# Patient Record
Sex: Male | Born: 1935 | Race: White | Hispanic: No | Marital: Married | Smoking: Former smoker
Health system: Southern US, Community
[De-identification: ages and names within clinical notes are randomized; demographics above are authoritative.]

## PROBLEM LIST (undated history)

## (undated) DIAGNOSIS — M109 Gout, unspecified: Secondary | ICD-10-CM

## (undated) DIAGNOSIS — N183 Chronic kidney disease, stage 3 unspecified: Secondary | ICD-10-CM

## (undated) DIAGNOSIS — I10 Essential (primary) hypertension: Secondary | ICD-10-CM

## (undated) DIAGNOSIS — K279 Peptic ulcer, site unspecified, unspecified as acute or chronic, without hemorrhage or perforation: Secondary | ICD-10-CM

## (undated) DIAGNOSIS — I340 Nonrheumatic mitral (valve) insufficiency: Secondary | ICD-10-CM

## (undated) DIAGNOSIS — D631 Anemia in chronic kidney disease: Secondary | ICD-10-CM

## (undated) DIAGNOSIS — N200 Calculus of kidney: Secondary | ICD-10-CM

## (undated) DIAGNOSIS — L0591 Pilonidal cyst without abscess: Secondary | ICD-10-CM

## (undated) DIAGNOSIS — I608 Other nontraumatic subarachnoid hemorrhage: Secondary | ICD-10-CM

## (undated) DIAGNOSIS — C801 Malignant (primary) neoplasm, unspecified: Secondary | ICD-10-CM

## (undated) DIAGNOSIS — I480 Paroxysmal atrial fibrillation: Secondary | ICD-10-CM

## (undated) DIAGNOSIS — J9383 Other pneumothorax: Secondary | ICD-10-CM

## (undated) HISTORY — PX: TONSILLECTOMY: SUR1361

## (undated) HISTORY — PX: HERNIA REPAIR: SHX51

## (undated) HISTORY — PX: UPPER GASTROINTESTINAL ENDOSCOPY: SHX188

---

## 1967-02-01 HISTORY — PX: CAROTID STENT: SHX1301

## 2003-10-21 ENCOUNTER — Other Ambulatory Visit: Payer: Self-pay

## 2003-11-17 ENCOUNTER — Ambulatory Visit: Payer: Self-pay | Admitting: Urology

## 2003-11-26 ENCOUNTER — Ambulatory Visit: Payer: Self-pay | Admitting: Radiation Oncology

## 2003-12-02 ENCOUNTER — Ambulatory Visit: Payer: Self-pay | Admitting: Radiation Oncology

## 2004-01-01 ENCOUNTER — Ambulatory Visit: Payer: Self-pay | Admitting: Radiation Oncology

## 2004-02-11 ENCOUNTER — Ambulatory Visit: Payer: Self-pay | Admitting: Radiation Oncology

## 2004-02-16 ENCOUNTER — Ambulatory Visit: Payer: Self-pay | Admitting: Radiation Oncology

## 2004-02-25 ENCOUNTER — Ambulatory Visit: Payer: Self-pay | Admitting: Radiation Oncology

## 2004-03-03 ENCOUNTER — Ambulatory Visit: Payer: Self-pay | Admitting: Radiation Oncology

## 2004-03-31 ENCOUNTER — Ambulatory Visit: Payer: Self-pay | Admitting: Radiation Oncology

## 2004-07-22 ENCOUNTER — Ambulatory Visit: Payer: Self-pay | Admitting: Radiation Oncology

## 2005-02-24 ENCOUNTER — Ambulatory Visit: Payer: Self-pay | Admitting: Radiation Oncology

## 2006-09-14 ENCOUNTER — Ambulatory Visit: Payer: Self-pay | Admitting: Unknown Physician Specialty

## 2007-06-06 ENCOUNTER — Ambulatory Visit: Payer: Self-pay | Admitting: Urology

## 2008-11-05 ENCOUNTER — Ambulatory Visit: Payer: Self-pay | Admitting: Surgery

## 2008-11-10 ENCOUNTER — Ambulatory Visit: Payer: Self-pay | Admitting: Surgery

## 2010-10-26 ENCOUNTER — Ambulatory Visit: Payer: Self-pay | Admitting: Internal Medicine

## 2010-10-27 ENCOUNTER — Ambulatory Visit: Payer: Self-pay | Admitting: Internal Medicine

## 2011-05-02 ENCOUNTER — Ambulatory Visit: Payer: Self-pay | Admitting: Urology

## 2012-05-10 ENCOUNTER — Ambulatory Visit: Payer: Self-pay | Admitting: Unknown Physician Specialty

## 2012-10-15 ENCOUNTER — Ambulatory Visit: Payer: Self-pay | Admitting: Internal Medicine

## 2014-06-03 ENCOUNTER — Other Ambulatory Visit: Payer: Self-pay | Admitting: Orthopedic Surgery

## 2014-06-03 DIAGNOSIS — M75102 Unspecified rotator cuff tear or rupture of left shoulder, not specified as traumatic: Secondary | ICD-10-CM

## 2014-06-14 ENCOUNTER — Ambulatory Visit
Admission: RE | Admit: 2014-06-14 | Discharge: 2014-06-14 | Disposition: A | Discharge status: Home / Self Care | Payer: Medicare Other | Source: Ambulatory Visit | Attending: Orthopedic Surgery | Admitting: Orthopedic Surgery

## 2014-06-14 DIAGNOSIS — M75102 Unspecified rotator cuff tear or rupture of left shoulder, not specified as traumatic: Secondary | ICD-10-CM | POA: Insufficient documentation

## 2014-07-03 DIAGNOSIS — M75122 Complete rotator cuff tear or rupture of left shoulder, not specified as traumatic: Secondary | ICD-10-CM | POA: Insufficient documentation

## 2016-12-24 ENCOUNTER — Emergency Department: Payer: Medicare Other

## 2016-12-24 ENCOUNTER — Other Ambulatory Visit: Payer: Self-pay

## 2016-12-24 ENCOUNTER — Observation Stay: Payer: Medicare Other

## 2016-12-24 ENCOUNTER — Observation Stay
Admission: EM | Admit: 2016-12-24 | Discharge: 2016-12-26 | Disposition: A | Discharge status: Home / Self Care | Payer: Medicare Other | Attending: Surgery | Admitting: Surgery

## 2016-12-24 DIAGNOSIS — K660 Peritoneal adhesions (postprocedural) (postinfection): Secondary | ICD-10-CM | POA: Insufficient documentation

## 2016-12-24 DIAGNOSIS — Z8711 Personal history of peptic ulcer disease: Secondary | ICD-10-CM | POA: Insufficient documentation

## 2016-12-24 DIAGNOSIS — Z85828 Personal history of other malignant neoplasm of skin: Secondary | ICD-10-CM | POA: Insufficient documentation

## 2016-12-24 DIAGNOSIS — Z8679 Personal history of other diseases of the circulatory system: Secondary | ICD-10-CM | POA: Insufficient documentation

## 2016-12-24 DIAGNOSIS — Z87891 Personal history of nicotine dependence: Secondary | ICD-10-CM | POA: Diagnosis not present

## 2016-12-24 DIAGNOSIS — Z9889 Other specified postprocedural states: Secondary | ICD-10-CM | POA: Diagnosis not present

## 2016-12-24 DIAGNOSIS — K810 Acute cholecystitis: Secondary | ICD-10-CM | POA: Diagnosis not present

## 2016-12-24 DIAGNOSIS — R1084 Generalized abdominal pain: Secondary | ICD-10-CM

## 2016-12-24 DIAGNOSIS — Z79899 Other long term (current) drug therapy: Secondary | ICD-10-CM | POA: Insufficient documentation

## 2016-12-24 DIAGNOSIS — R112 Nausea with vomiting, unspecified: Secondary | ICD-10-CM

## 2016-12-24 DIAGNOSIS — Z87442 Personal history of urinary calculi: Secondary | ICD-10-CM | POA: Diagnosis not present

## 2016-12-24 DIAGNOSIS — Z7982 Long term (current) use of aspirin: Secondary | ICD-10-CM | POA: Diagnosis not present

## 2016-12-24 DIAGNOSIS — K819 Cholecystitis, unspecified: Secondary | ICD-10-CM

## 2016-12-24 HISTORY — DX: Calculus of kidney: N20.0

## 2016-12-24 HISTORY — DX: Gout, unspecified: M10.9

## 2016-12-24 HISTORY — DX: Other nontraumatic subarachnoid hemorrhage: I60.8

## 2016-12-24 HISTORY — DX: Malignant (primary) neoplasm, unspecified: C80.1

## 2016-12-24 HISTORY — DX: Pilonidal cyst without abscess: L05.91

## 2016-12-24 HISTORY — DX: Other pneumothorax: J93.83

## 2016-12-24 HISTORY — DX: Peptic ulcer, site unspecified, unspecified as acute or chronic, without hemorrhage or perforation: K27.9

## 2016-12-24 LAB — HEPATIC FUNCTION PANEL
ALBUMIN: 4.6 g/dL (ref 3.5–5.0)
ALK PHOS: 65 U/L (ref 38–126)
ALT: 35 U/L (ref 17–63)
AST: 27 U/L (ref 15–41)
Bilirubin, Direct: 0.2 mg/dL (ref 0.1–0.5)
Indirect Bilirubin: 0.9 mg/dL (ref 0.3–0.9)
TOTAL PROTEIN: 7.7 g/dL (ref 6.5–8.1)
Total Bilirubin: 1.1 mg/dL (ref 0.3–1.2)

## 2016-12-24 LAB — TROPONIN I: Troponin I: <0.03 ng/mL (ref <0.03)

## 2016-12-24 LAB — URINALYSIS, COMPLETE (UACMP) WITH MICROSCOPIC
BILIRUBIN URINE: NEGATIVE
Bacteria, UA: NONE SEEN
GLUCOSE, UA: NEGATIVE mg/dL
Hgb urine dipstick: NEGATIVE
KETONES UR: 5 mg/dL — AB
LEUKOCYTES UA: NEGATIVE
Nitrite: NEGATIVE
PH: 6 (ref 5.0–8.0)
PROTEIN: NEGATIVE mg/dL
SQUAMOUS EPITHELIAL / LPF: NONE SEEN
Specific Gravity, Urine: >1.046 — ABNORMAL HIGH (ref 1.005–1.030)

## 2016-12-24 LAB — BASIC METABOLIC PANEL
ANION GAP: 9 (ref 5–15)
BUN: 17 mg/dL (ref 6–20)
CHLORIDE: 104 mmol/L (ref 101–111)
CO2: 26 mmol/L (ref 22–32)
Calcium: 9.3 mg/dL (ref 8.9–10.3)
Creatinine, Ser: 1.01 mg/dL (ref 0.61–1.24)
GFR calc Af Amer: >60 mL/min (ref >60)
GLUCOSE: 183 mg/dL — AB (ref 65–99)
POTASSIUM: 4.3 mmol/L (ref 3.5–5.1)
Sodium: 139 mmol/L (ref 135–145)

## 2016-12-24 LAB — CBC
HEMATOCRIT: 45.2 % (ref 40.0–52.0)
HEMATOCRIT: 41.3 % (ref 40.0–52.0)
HEMOGLOBIN: 15.5 g/dL (ref 13.0–18.0)
Hemoglobin: 14.1 g/dL (ref 13.0–18.0)
MCH: 32.5 pg (ref 26.0–34.0)
MCH: 32.5 pg (ref 26.0–34.0)
MCHC: 34.3 g/dL (ref 32.0–36.0)
MCHC: 34.1 g/dL (ref 32.0–36.0)
MCV: 94.7 fL (ref 80.0–100.0)
MCV: 95.3 fL (ref 80.0–100.0)
PLATELETS: 151 10*3/uL (ref 150–440)
Platelets: 164 10*3/uL (ref 150–440)
RBC: 4.77 MIL/uL (ref 4.40–5.90)
RBC: 4.33 MIL/uL — AB (ref 4.40–5.90)
RDW: 13.8 % (ref 11.5–14.5)
RDW: 13.7 % (ref 11.5–14.5)
WBC: 8.6 10*3/uL (ref 3.8–10.6)
WBC: 7.7 10*3/uL (ref 3.8–10.6)

## 2016-12-24 LAB — CREATININE, SERUM
Creatinine, Ser: 1.14 mg/dL (ref 0.61–1.24)
GFR calc Af Amer: >60 mL/min (ref >60)
GFR calc non Af Amer: 57 mL/min — ABNORMAL LOW (ref >60)

## 2016-12-24 LAB — TYPE AND SCREEN
ABO/RH(D): O POS
Antibody Screen: NEGATIVE

## 2016-12-24 LAB — LIPASE, BLOOD: LIPASE: 19 U/L (ref 11–51)

## 2016-12-24 MED ORDER — ONDANSETRON HCL 4 MG/2ML IJ SOLN
4.0000 mg | Freq: Four times a day (QID) | INTRAMUSCULAR | Status: DC | PRN
  Administered 2016-12-25: 4 mg via INTRAVENOUS

## 2016-12-24 MED ORDER — OXYCODONE HCL 5 MG PO TABS
5.0000 mg | ORAL_TABLET | ORAL | Status: DC | PRN
  Administered 2016-12-24: 10 mg via ORAL
  Filled 2016-12-24: qty 2

## 2016-12-24 MED ORDER — HYDRALAZINE HCL 20 MG/ML IJ SOLN
10.0000 mg | INTRAMUSCULAR | Status: DC | PRN
  Administered 2016-12-25: 10 mg via INTRAVENOUS

## 2016-12-24 MED ORDER — LACTATED RINGERS IV SOLN
INTRAVENOUS | Status: DC
  Administered 2016-12-24 – 2016-12-25 (×4): via INTRAVENOUS

## 2016-12-24 MED ORDER — DEXTROSE 5 % IV SOLN
2.0000 g | INTRAVENOUS | Status: DC
  Administered 2016-12-24: 2 g via INTRAVENOUS
  Filled 2016-12-24 (×3): qty 2

## 2016-12-24 MED ORDER — PIPERACILLIN-TAZOBACTAM 3.375 G IVPB 30 MIN
3.3750 g | Freq: Once | INTRAVENOUS | Status: AC
  Administered 2016-12-24: 3.375 g via INTRAVENOUS
  Filled 2016-12-24: qty 50

## 2016-12-24 MED ORDER — IOPAMIDOL (ISOVUE-370) INJECTION 76%
125.0000 mL | Freq: Once | INTRAVENOUS | Status: AC | PRN
  Administered 2016-12-24: 125 mL via INTRAVENOUS

## 2016-12-24 MED ORDER — SODIUM CHLORIDE 0.9 % IV BOLUS (SEPSIS)
1000.0000 mL | Freq: Once | INTRAVENOUS | Status: AC
  Administered 2016-12-24: 1000 mL via INTRAVENOUS

## 2016-12-24 MED ORDER — ONDANSETRON 4 MG PO TBDP: 4.0000 mg | ORAL_TABLET | Freq: Four times a day (QID) | ORAL | Status: DC | PRN

## 2016-12-24 MED ORDER — ACETAMINOPHEN 500 MG PO TABS
1000.0000 mg | ORAL_TABLET | Freq: Four times a day (QID) | ORAL | Status: DC
  Administered 2016-12-24 – 2016-12-26 (×7): 1000 mg via ORAL
  Filled 2016-12-24 (×7): qty 2

## 2016-12-24 MED ORDER — ENOXAPARIN SODIUM 40 MG/0.4ML ~~LOC~~ SOLN
40.0000 mg | SUBCUTANEOUS | Status: DC
  Filled 2016-12-24: qty 0.4

## 2016-12-24 MED ORDER — PANTOPRAZOLE SODIUM 40 MG PO TBEC
40.0000 mg | DELAYED_RELEASE_TABLET | Freq: Every day | ORAL | Status: DC
  Administered 2016-12-24 – 2016-12-26 (×2): 40 mg via ORAL
  Filled 2016-12-24 (×2): qty 1

## 2016-12-24 MED ORDER — ONDANSETRON HCL 4 MG/2ML IJ SOLN
4.0000 mg | Freq: Once | INTRAMUSCULAR | Status: AC
  Administered 2016-12-24: 4 mg via INTRAVENOUS
  Filled 2016-12-24: qty 2

## 2016-12-24 MED ORDER — MORPHINE SULFATE (PF) 2 MG/ML IV SOLN
2.0000 mg | INTRAVENOUS | Status: DC | PRN
  Administered 2016-12-25: 2 mg via INTRAVENOUS
  Filled 2016-12-24: qty 1

## 2016-12-24 NOTE — ED Notes (Signed)
Patient r/f XR 

## 2016-12-24 NOTE — Plan of Care (Signed)
Patient has had minimal amount of pain today

## 2016-12-24 NOTE — H&P (Signed)
Patient ID: Craig Ayala, male   DOB: [birth year], 83 y.o.   MRN: [id number]  HPI Craig Ayala is a 83 y.o. male  Just to the emergency room with history of epigastric pain and right upper quadrant pain that started after heavy meal last night.First and he had the pain wason Thursday night after sensing supper Reported at this time the pain la sted for a few hours and last night pain was severe and lasted several hours. Now the pain as subsided. No fevers or chills. No evidence of cholangitis or biliary obstruction.No previous abdominal operations. Is able to perform more than 4 Mets of activity without shortness of breath or chest pain. He is very independent and is able to perform all his  H is mind is pristine  CT personally reviewed, thickened GB w GS. No biliary dilation. LFT nml , nml WBC    HPI  Past Medical History:  Diagnosis Date  . 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 Cancer (HCC)    squamous cell  . Gout   . Kidney stone   . Peptic ulcer   . Pilonidal cyst   . Spontaneous pneumothorax     Past Surgical History:  Procedure Laterality Date  . UPPER GASTROINTESTINAL ENDOSCOPY      No family history on file.  Social History Social History   Tobacco Use  . Smoking status: Former Research scientist (life sciences)  . Smokeless tobacco: Never Used  Substance Use Topics  . Alcohol use: No    Frequency: Never  . Drug use: Not on file    No Known Allergies  Current Facility-Administered Medications  Medication Dose Route Frequency Provider Last Rate Last Dose  . acetaminophen (TYLENOL) tablet 1,000 mg  1,000 mg Oral Q6H Ulysees Robarts F, MD      . cefTRIAXone (ROCEPHIN) 2 g in dextrose 5 % 50 mL IVPB  2 g Intravenous Q24H Quinntin Malter F, MD      . enoxaparin (LOVENOX) injection 40 mg  40 mg Subcutaneous Q24H Lavan Imes F, MD      . hydrALAZINE (APRESOLINE) injection 10 mg  10 mg Intravenous Q2H PRN Taneesha Edgin F, MD      . lactated ringers infusion   Intravenous Continuous Ivah Girardot,  Leeza Heiner F, MD      . morphine 2 MG/ML injection 2 mg  2 mg Intravenous Q4H PRN Alanys Godino F, MD      . ondansetron (ZOFRAN-ODT) disintegrating tablet 4 mg  4 mg Oral Q6H PRN Ariza Evans F, MD       Or  . ondansetron (ZOFRAN) injection 4 mg  4 mg Intravenous Q6H PRN Lakechia Nay F, MD      . oxyCODONE (Oxy IR/ROXICODONE) immediate release tablet 5-10 mg  5-10 mg Oral Q4H PRN Shavawn Stobaugh F, MD      . pantoprazole (PROTONIX) EC tablet 40 mg  40 mg Oral Daily Avanish Cerullo, Marjory Lies, MD       Current Outpatient Medications  Medication Sig Dispense Refill  . aspirin EC 81 MG tablet Take 81 mg by mouth daily.    . Cholecalciferol (D3 ADULT PO) Take 1 tablet by mouth daily.    . Ferrous Sulfate (HM IRON SLOW RELEASE) 142 (45 Fe) MG TBCR Take 45 mg by mouth daily.    . Omega-3 Fatty Acids (FISH OIL) 1200 MG CAPS Take 1,200 mg by mouth 2 (two) times daily.    Marland Kitchen omeprazole (PRILOSEC) 20 MG capsule Take 20 mg by mouth  daily.  3     Review of Systems Full ROS  was asked and was negative except for the information on the HPI  Physical Exam Blood pressure 133/78, pulse 68, temperature 98.4 F (36.9 C), temperature source Oral, resp. rate 12, height 5\' 4"  (1.626 m), weight 101.2 kg (223 lb), SpO2 95 %. CONSTITUTIONAL: NAD EYES: Pupils are equal, round, and reactive to light, Sclera are non-icteric. EARS, NOSE, MOUTH AND THROAT: The oropharynx is clear. The oral mucosa is pink and moist. Hearing is intact to voice. LYMPH NODES:  Lymph nodes in the neck are normal. RESPIRATORY:  Lungs are clear. There is normal respiratory effort, with equal breath sounds bilaterally, and without pathologic use of accessory muscles. CARDIOVASCULAR: Heart is regular without murmurs, gallops, or rubs. GI: The abdomen is soft, mild tenderness RUQ, no peritonitis. There are no palpable masses. There is no hepatosplenomegaly. There are normal bowel sounds in all quadrants. GU: Rectal deferred.   MUSCULOSKELETAL: Normal muscle  strength and tone. No cyanosis or edema.   SKIN: Turgor is good and there are no pathologic skin lesions or ulcers. NEUROLOGIC: Motor and sensation is grossly normal. Cranial nerves are grossly intact. PSYCH:  Oriented to person, place and time. Affect is normal.  Data Reviewed  I have personally reviewed the patient's imaging, laboratory findings and medical records.    Assessment/Plan 83 year old male with early cholecystitis confirmed on CT scan. I will order an ultrasound to make sure there is no evidence of biliary obstruction or need for cholangiogram. I do recommend cholecystectomy The risks, benefits, complications, treatment options, and expected outcomes were discussed with the patient. The possibilities of bleeding, recurrent infection, finding a normal gallbladder, perforation of viscus organs, damage to surrounding structures, bile leak, abscess formation, needing a drain placed, the need for additional procedures, reaction to medication, pulmonary aspiration,  failure to diagnose a condition, the possible need to convert to an open procedure, and creating a complication requiring transfusion or operation were discussed with the patient. The patient and/or family concurred with the proposed plan, giving informed consent.  We will plan for admission, ivf, iv A/BS and lap chole   Caroleen Hamman, MD FACS General Surgeon 12/24/2016, 10:51 AM

## 2016-12-24 NOTE — ED Notes (Signed)
Pt r/f CT

## 2016-12-24 NOTE — ED Provider Notes (Signed)
[facility] Emergency Department Provider Note  ____________________________________________  Time seen: Approximately 6:07 AM  I have reviewed the triage vital signs and the nursing notes.   HISTORY  Chief Complaint Chest Pain   HPI Craig Ayala is a 83 y.o. male with a history of a spontaneous pneumothorax, peptic ulcer disease, kidney stones, and an aneurysm subarachnoid hemorrhage or presents for evaluation of abdominal pain. Patient reports a similar episode 2 nights ago. The pain started after dinner, sharp, diffuse in his abdomen and resolved after he took Aleve. This evening the pain returned after dinner and he was complaining of 7 out of 10 pain located diffusely in his abdomen, radiating to his back, sharp, associated with the 3 episodes of nonbloody nonbilious emesis. Patient noticed 1 episode of black stool yesterday morning. No further episodes. No coffee-ground emesis or hematemesis. No chest pain or shortness of breath. No dysuria hematuria. No prior abdominal surgeries. Patient has recently being started on PO iron.  Past Medical History:  Diagnosis Date  . 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 Cancer (HCC)    squamous cell  . Gout   . Kidney stone   . Peptic ulcer   . Pilonidal cyst   . Spontaneous pneumothorax     Patient Active Problem List   Diagnosis Date Noted  . Cholecystitis, acute 12/24/2016    Past Surgical History:  Procedure Laterality Date  . UPPER GASTROINTESTINAL ENDOSCOPY      Prior to Admission medications   Medication Sig Start Date End Date Taking? Authorizing Provider  aspirin EC 81 MG tablet Take 81 mg by mouth daily.   Yes [provider]  Cholecalciferol (D3 ADULT PO) Take 1 tablet by mouth daily.   Yes [provider]  Ferrous Sulfate (HM IRON SLOW RELEASE) 142 (45 Fe) MG TBCR Take 45 mg by mouth daily.   Yes [provider]  Omega-3 Fatty Acids (FISH OIL) 1200 MG CAPS  Take 1,200 mg by mouth 2 (two) times daily.   Yes [provider]  omeprazole (PRILOSEC) 20 MG capsule Take 20 mg by mouth daily. 10/25/16  Yes [provider]    Allergies Patient has no known allergies.  History reviewed. No pertinent family history.  Social History Social History   Tobacco Use  . Smoking status: Former Research scientist (life sciences)  . Smokeless tobacco: Never Used  Substance Use Topics  . Alcohol use: No    Frequency: Never  . Drug use: Not on file    Review of Systems  Constitutional: Negative for fever. Eyes: Negative for visual changes. ENT: Negative for sore throat. Neck: No neck pain  Cardiovascular: Negative for chest pain. Respiratory: Negative for shortness of breath. Gastrointestinal: + diffuse abdominal pain, nausea, vomiting, and black stool. No diarrhea. Genitourinary: Negative for dysuria. Musculoskeletal: Negative for back pain. Skin: Negative for rash. Neurological: Negative for headaches, weakness or numbness. Psych: No SI or HI  ____________________________________________   PHYSICAL EXAM:  VITAL SIGNS: ED Triage Vitals  Enc Vitals Group     BP 12/24/16 0508 (!) 192/100     Pulse Rate 12/24/16 0508 79     Resp 12/24/16 0508 19     Temp 12/24/16 0508 98.4 F (36.9 C)     Temp Source 12/24/16 0508 Oral     SpO2 12/24/16 0508 100 %     Weight 12/24/16 0504 223 lb (101.2 kg)     Height --      Head Circumference --  Peak Flow --      Pain Score 12/24/16 0504 5     Pain Loc --      Pain Edu? --      Excl. in [place]? --     Constitutional: Alert and oriented. Well appearing and in no apparent distress. HEENT:      Head: Normocephalic and atraumatic.         Eyes: Conjunctivae are normal. Sclera is non-icteric.       Mouth/Throat: Mucous membranes are moist.       Neck: Supple with no signs of meningismus. Cardiovascular: Regular rate and rhythm. No murmurs, gallops, or rubs. 2+ symmetrical distal pulses are present in all  extremities. No JVD. Respiratory: Normal respiratory effort. Lungs are clear to auscultation bilaterally. No wheezes, crackles, or rhonchi.  Gastrointestinal: Soft, non tender, and non distended with positive bowel sounds. No rebound or guarding. rectal exam showing light brown stool guaiac negative  Genitourinary: No CVA tenderness. Musculoskeletal: Nontender with normal range of motion in all extremities. No edema, cyanosis, or erythema of extremities. Neurologic: Normal speech and language. Face is symmetric. Moving all extremities. No gross focal neurologic deficits are appreciated. Skin: Skin is warm, dry and intact. No rash noted. Psychiatric: Mood and affect are normal. Speech and behavior are normal.  ____________________________________________   LABS (all labs ordered are listed, but only abnormal results are displayed)  Labs Reviewed  BASIC METABOLIC PANEL - Abnormal; Notable for the following components:      Result Value   Glucose, Bld 183 (*)    All other components within normal limits  URINALYSIS, COMPLETE (UACMP) WITH MICROSCOPIC - Abnormal; Notable for the following components:   Color, Urine YELLOW (*)    APPearance CLEAR (*)    Specific Gravity, Urine >1.046 (*)    Ketones, ur 5 (*)    All other components within normal limits  CBC - Abnormal; Notable for the following components:   RBC 4.33 (*)    All other components within normal limits  CREATININE, SERUM - Abnormal; Notable for the following components:   GFR calc non Af Amer 57 (*)    All other components within normal limits  CBC  TROPONIN I  HEPATIC FUNCTION PANEL  LIPASE, BLOOD  COMPREHENSIVE METABOLIC PANEL  TYPE AND SCREEN   ____________________________________________  EKG  ED ECG REPORT I, Rudene Re, the attending physician, personally viewed and interpreted this ECG.  Normal sinus rhythm, rate of 76, normal intervals, normal axis, no ST elevations or depressions. Diffuse T-wave  flattening. No significant changes when compared to prior from 2010 ____________________________________________  RADIOLOGY  XR acute abdomen:  1. No acute cardiopulmonary abnormality. 2. No free intraperitoneal air. 3. Upper limits normal gas-filled small bowel without fluid levels or other evidence of small-bowel obstruction.  CT a/p: OND ____________________________________________   PROCEDURES  Procedure(s) performed: None Procedures Critical Care performed:  None ____________________________________________   INITIAL IMPRESSION / ASSESSMENT AND PLAN / ED COURSE  83 y.o. male with a history of a spontaneous pneumothorax, peptic ulcer disease, kidney stones, and an aneurysm subarachnoid hemorrhage or presents for evaluation of abdominal pain, one episode of black stool, NBNB emesis. Patient is extremely well appearing, no distress, he is hypertensive with blood pressure of 192/100, his abdomen is soft and nondistended with positive bowel sounds, no tenderness throughout, rectal exam showing light brown stool guaiac negative. EKG with no ischemic changes. Differential diagnoses including peptic ulcer disease versus GERD versus gastritis versus GB versus pancreatitis versus  kidney stone versus diverticulitis vs mesenteric ischemia vs AAA vs dissection. KUB and chest x-ray with no acute findings. We'll send patient for CT angio abdomen and pelvis, will check CBC, CMP, lipase, urinalysis. We'll give fluids and Zofran.   Clinical Course as of Dec 26 610  Sat Dec 24, 2016  0714 Labs with no acute findings. CT and UA pending. Care transferred to Dr. Jimmye Norman.  [CV]    Clinical Course User Index [CV] Alfred Levins [place], MD     As part of my medical decision making, I reviewed the following data within the [place] notes reviewed and incorporated, Labs reviewed , EKG interpreted , Old EKG reviewed, Old chart reviewed, Patient signed out to Dr. Jimmye Norman,  Radiograph reviewed , Notes from prior ED visits and [place] Controlled Substance Database    Pertinent labs & imaging results that were available during my care of the patient were reviewed by me and considered in my medical decision making (see chart for details).    ____________________________________________   FINAL CLINICAL IMPRESSION(S) / ED DIAGNOSES  Final diagnoses:  Generalized abdominal pain  Non-intractable vomiting with nausea, unspecified vomiting type  Cholecystitis      NEW MEDICATIONS STARTED DURING THIS VISIT:  ED Discharge Orders    None       Note:  This document was prepared using Dragon voice recognition software and may include unintentional dictation errors.    Rudene Re, MD 12/25/16 [phone]

## 2016-12-24 NOTE — ED Notes (Signed)
Dr. Dahlia Byes in room to talk to pt.

## 2016-12-24 NOTE — ED Notes (Signed)
Pt returned from US

## 2016-12-24 NOTE — ED Notes (Signed)
Pt transported to US

## 2016-12-24 NOTE — ED Provider Notes (Signed)
Patient has evidence of acute cholecystitis on CT.  I will discussed with general surgery for admission.   Earleen Newport, MD 12/24/16 [phone]

## 2016-12-24 NOTE — ED Triage Notes (Signed)
Patient c/o right chest pain/epigastric pain radiating to back. Patient reports pain began Wednesday night. Patient describes pain as constant. Patient reports radiation to back and nausea. Patient reports 1 black stool and a history of peptic ulcer.

## 2016-12-25 ENCOUNTER — Observation Stay: Payer: Medicare Other | Admitting: Anesthesiology

## 2016-12-25 ENCOUNTER — Encounter: Payer: Self-pay | Admitting: Anesthesiology

## 2016-12-25 ENCOUNTER — Encounter
Admission: EM | Disposition: A | Discharge status: Home / Self Care | Payer: Self-pay | Source: Home / Self Care | Attending: Emergency Medicine

## 2016-12-25 DIAGNOSIS — K810 Acute cholecystitis: Secondary | ICD-10-CM | POA: Diagnosis not present

## 2016-12-25 HISTORY — PX: CHOLECYSTECTOMY: SHX55

## 2016-12-25 LAB — COMPREHENSIVE METABOLIC PANEL
ALBUMIN: 3.6 g/dL (ref 3.5–5.0)
ALT: 34 U/L (ref 17–63)
ANION GAP: 5 (ref 5–15)
AST: 26 U/L (ref 15–41)
Alkaline Phosphatase: 50 U/L (ref 38–126)
BILIRUBIN TOTAL: 1.1 mg/dL (ref 0.3–1.2)
BUN: 14 mg/dL (ref 6–20)
CALCIUM: 8.6 mg/dL — AB (ref 8.9–10.3)
CO2: 28 mmol/L (ref 22–32)
CREATININE: 0.98 mg/dL (ref 0.61–1.24)
Chloride: 106 mmol/L (ref 101–111)
GFR calc Af Amer: >60 mL/min (ref >60)
GFR calc non Af Amer: >60 mL/min (ref >60)
Glucose, Bld: 96 mg/dL (ref 65–99)
Potassium: 3.8 mmol/L (ref 3.5–5.1)
Sodium: 139 mmol/L (ref 135–145)
TOTAL PROTEIN: 6.2 g/dL — AB (ref 6.5–8.1)

## 2016-12-25 SURGERY — LAPAROSCOPIC CHOLECYSTECTOMY
Anesthesia: General

## 2016-12-25 MED ORDER — MIDAZOLAM HCL 2 MG/2ML IJ SOLN
INTRAMUSCULAR | Status: DC | PRN
  Administered 2016-12-25 (×2): 1 mg via INTRAVENOUS

## 2016-12-25 MED ORDER — BUPIVACAINE-EPINEPHRINE 0.25% -1:200000 IJ SOLN
INTRAMUSCULAR | Status: DC | PRN
  Administered 2016-12-25: 30 mL

## 2016-12-25 MED ORDER — SUCCINYLCHOLINE CHLORIDE 20 MG/ML IJ SOLN
INTRAMUSCULAR | Status: DC | PRN
  Administered 2016-12-25: 100 mg via INTRAVENOUS

## 2016-12-25 MED ORDER — FENTANYL CITRATE (PF) 250 MCG/5ML IJ SOLN
INTRAMUSCULAR | Status: AC
  Filled 2016-12-25: qty 5

## 2016-12-25 MED ORDER — PROPOFOL 10 MG/ML IV BOLUS
INTRAVENOUS | Status: AC
  Filled 2016-12-25: qty 20

## 2016-12-25 MED ORDER — DEXAMETHASONE SODIUM PHOSPHATE 10 MG/ML IJ SOLN
INTRAMUSCULAR | Status: DC | PRN
  Administered 2016-12-25: 10 mg via INTRAVENOUS

## 2016-12-25 MED ORDER — CEFAZOLIN SODIUM-DEXTROSE 2-3 GM-%(50ML) IV SOLR
INTRAVENOUS | Status: DC | PRN
  Administered 2016-12-25: 2 g via INTRAVENOUS

## 2016-12-25 MED ORDER — ROCURONIUM BROMIDE 100 MG/10ML IV SOLN
INTRAVENOUS | Status: DC | PRN
  Administered 2016-12-25: 10 mg via INTRAVENOUS
  Administered 2016-12-25: 40 mg via INTRAVENOUS

## 2016-12-25 MED ORDER — PROPOFOL 10 MG/ML IV BOLUS
INTRAVENOUS | Status: DC | PRN
  Administered 2016-12-25: 150 mg via INTRAVENOUS

## 2016-12-25 MED ORDER — OXYCODONE HCL 5 MG/5ML PO SOLN: 5.0000 mg | Freq: Once | ORAL | Status: DC | PRN

## 2016-12-25 MED ORDER — SUGAMMADEX SODIUM 200 MG/2ML IV SOLN
INTRAVENOUS | Status: AC
  Filled 2016-12-25: qty 2

## 2016-12-25 MED ORDER — MIDAZOLAM HCL 2 MG/2ML IJ SOLN
INTRAMUSCULAR | Status: AC
  Filled 2016-12-25: qty 2

## 2016-12-25 MED ORDER — FENTANYL CITRATE (PF) 100 MCG/2ML IJ SOLN
25.0000 ug | INTRAMUSCULAR | Status: DC | PRN
  Administered 2016-12-25 (×2): 50 ug via INTRAVENOUS

## 2016-12-25 MED ORDER — CEFAZOLIN SODIUM 1 G IJ SOLR
INTRAMUSCULAR | Status: AC
  Filled 2016-12-25: qty 20

## 2016-12-25 MED ORDER — HYDRALAZINE HCL 20 MG/ML IJ SOLN
INTRAMUSCULAR | Status: AC
  Filled 2016-12-25: qty 1

## 2016-12-25 MED ORDER — LIDOCAINE HCL (CARDIAC) 20 MG/ML IV SOLN
INTRAVENOUS | Status: DC | PRN
  Administered 2016-12-25: 80 mg via INTRAVENOUS

## 2016-12-25 MED ORDER — HYDROCODONE-ACETAMINOPHEN 5-325 MG PO TABS: 1.0000 | ORAL_TABLET | ORAL | Status: DC | PRN

## 2016-12-25 MED ORDER — OXYCODONE HCL 5 MG PO TABS: 5.0000 mg | ORAL_TABLET | Freq: Once | ORAL | Status: DC | PRN

## 2016-12-25 MED ORDER — FENTANYL CITRATE (PF) 100 MCG/2ML IJ SOLN
INTRAMUSCULAR | Status: AC
  Filled 2016-12-25: qty 2

## 2016-12-25 MED ORDER — BUPIVACAINE-EPINEPHRINE (PF) 0.25% -1:200000 IJ SOLN
INTRAMUSCULAR | Status: AC
  Filled 2016-12-25: qty 30

## 2016-12-25 MED ORDER — FENTANYL CITRATE (PF) 100 MCG/2ML IJ SOLN
INTRAMUSCULAR | Status: DC | PRN
  Administered 2016-12-25 (×5): 50 ug via INTRAVENOUS

## 2016-12-25 SURGICAL SUPPLY — 45 items
APPLICATOR COTTON TIP 6IN STRL (MISCELLANEOUS) ×3 IMPLANT
APPLIER CLIP 5 13 M/L LIGAMAX5 (MISCELLANEOUS) ×3
BLADE SURG 15 STRL LF DISP TIS (BLADE) ×1 IMPLANT
BLADE SURG 15 STRL SS (BLADE) ×2
CANISTER SUCT 1200ML W/VALVE (MISCELLANEOUS) ×3 IMPLANT
CHLORAPREP W/TINT 26ML (MISCELLANEOUS) ×3 IMPLANT
CHOLANGIOGRAM CATH TAUT (CATHETERS) IMPLANT
CLEANER CAUTERY TIP 5X5 PAD (MISCELLANEOUS) ×1 IMPLANT
CLIP APPLIE 5 13 M/L LIGAMAX5 (MISCELLANEOUS) ×1 IMPLANT
DECANTER SPIKE VIAL GLASS SM (MISCELLANEOUS) ×6 IMPLANT
DERMABOND ADVANCED (GAUZE/BANDAGES/DRESSINGS) ×2
DERMABOND ADVANCED .7 DNX12 (GAUZE/BANDAGES/DRESSINGS) ×1 IMPLANT
DRAPE C-ARM XRAY 36X54 (DRAPES) ×3 IMPLANT
ELECT CAUTERY BLADE 6.4 (BLADE) ×3 IMPLANT
ELECT REM PT RETURN 9FT ADLT (ELECTROSURGICAL) ×3
ELECTRODE REM PT RTRN 9FT ADLT (ELECTROSURGICAL) ×1 IMPLANT
GLOVE BIO SURGEON STRL SZ7 (GLOVE) ×3 IMPLANT
GOWN STRL REUS W/ TWL LRG LVL3 (GOWN DISPOSABLE) ×3 IMPLANT
GOWN STRL REUS W/TWL LRG LVL3 (GOWN DISPOSABLE) ×6
IRRIGATION STRYKERFLOW (MISCELLANEOUS) ×1 IMPLANT
IRRIGATOR STRYKERFLOW (MISCELLANEOUS) ×3
IV CATH ANGIO 12GX3 LT BLUE (NEEDLE) ×3 IMPLANT
IV NS 1000ML (IV SOLUTION) ×2
IV NS 1000ML BAXH (IV SOLUTION) ×1 IMPLANT
L-HOOK LAP DISP 36CM (ELECTROSURGICAL) ×3
LHOOK LAP DISP 36CM (ELECTROSURGICAL) ×1 IMPLANT
NEEDLE HYPO 22GX1.5 SAFETY (NEEDLE) ×3 IMPLANT
PACK LAP CHOLECYSTECTOMY (MISCELLANEOUS) ×3 IMPLANT
PAD CLEANER CAUTERY TIP 5X5 (MISCELLANEOUS) ×2
PENCIL ELECTRO HAND CTR (MISCELLANEOUS) ×3 IMPLANT
POUCH SPECIMEN RETRIEVAL 10MM (ENDOMECHANICALS) ×3 IMPLANT
SCISSORS METZENBAUM CVD 33 (INSTRUMENTS) ×3 IMPLANT
SLEEVE ENDOPATH XCEL 5M (ENDOMECHANICALS) ×6 IMPLANT
SOL ANTI-FOG 6CC FOG-OUT (MISCELLANEOUS) ×1 IMPLANT
SOL FOG-OUT ANTI-FOG 6CC (MISCELLANEOUS) ×2
SPONGE LAP 18X18 5 PK (GAUZE/BANDAGES/DRESSINGS) ×3 IMPLANT
STOPCOCK 4 WAY LG BORE MALE ST (IV SETS) IMPLANT
SUT ETHIBOND 0 MO6 C/R (SUTURE) IMPLANT
SUT MNCRL AB 4-0 PS2 18 (SUTURE) ×3 IMPLANT
SUT VICRYL 0 AB UR-6 (SUTURE) ×6 IMPLANT
SYR 20CC LL (SYRINGE) ×3 IMPLANT
TROCAR XCEL BLUNT TIP 100MML (ENDOMECHANICALS) ×3 IMPLANT
TROCAR XCEL NON-BLD 5MMX100MML (ENDOMECHANICALS) ×3 IMPLANT
TUBING INSUFFLATION (TUBING) ×3 IMPLANT
WATER STERILE IRR 1000ML POUR (IV SOLUTION) ×3 IMPLANT

## 2016-12-25 NOTE — Op Note (Signed)
Laparoscopic Cholecystectomy  Pre-operative Diagnosis: acute cholecystitis  Post-operative Diagnosis: same  Procedure: laparoscopic cholecystectomy  Surgeon: Caroleen Hamman, MD FACS  Anesthesia: Gen. with endotracheal tube   Findings: Acute Cholecystitis  Inflammatory response causing some raw  Areas from liver bed  Estimated Blood Loss: 25-50cc         Drains: none         Specimens: Gallbladder           Complications: none   Procedure Details  The patient was seen again in the Holding Room. The benefits, complications, treatment options, and expected outcomes were discussed with the patient. The risks of bleeding, infection, recurrence of symptoms, failure to resolve symptoms, bile duct damage, bile duct leak, retained common bile duct stone, bowel injury, any of which could require further surgery and/or ERCP, stent, or papillotomy were reviewed with the patient. The likelihood of improving the patient's symptoms with return to their baseline status is good.  The patient and/or family concurred with the proposed plan, giving informed consent.  The patient was taken to Operating Room, identified as Craig Ayala and the procedure verified as Laparoscopic Cholecystectomy.  A Time Out was held and the above information confirmed.  Prior to the induction of general anesthesia, antibiotic prophylaxis was administered. VTE prophylaxis was in place. General endotracheal anesthesia was then administered and tolerated well. After the induction, the abdomen was prepped with Chloraprep and draped in the sterile fashion. The patient was positioned in the supine position.  Cut down technique was used to enter the abdominal cavity and a Hasson trochar was placed after two vicryl stitches were anchored to the fascia. Pneumoperitoneum was then created with CO2 and tolerated well without any adverse changes in the patient's vital signs.  Three 5-mm ports were placed in the right upper quadrant all  under direct vision. All skin incisions  were infiltrated with a local anesthetic agent before making the incision and placing the trocars.   The patient was positioned  in reverse Trendelenburg, tilted slightly to the patient's left.  The gallbladder was identified, the fundus grasped and retracted cephalad. Adhesions were lysed bluntly. The infundibulum was grasped and retracted laterally, exposing the peritoneum overlying the triangle of Calot. This was then divided and exposed in a blunt fashion. An extended critical view of the cystic duct and cystic artery was obtained.  The cystic duct was clearly identified and bluntly dissected.   Artery and duct were double clipped and divided.  The gallbladder was taken from the gallbladder fossa in a retrograde fashion with the electrocautery. The gallbladder was removed and placed in an Endocatch bag. The liver bed was irrigated and inspected. Hemostasis was achieved with the electrocautery. Copious irrigation was utilized and was repeatedly aspirated until clear.  The gallbladder and Endocatch sac were then removed through a port site.    Inspection of the right upper quadrant was performed. No bleeding, bile duct injury or leak, or bowel injury was noted. Pneumoperitoneum was released.  The periumbilical port site was closed with interrumpted 0 Vicryl sutures. 4-0 subcuticular Monocryl was used to close the skin. Dermabond was  applied.  The patient was then extubated and brought to the recovery room in stable condition. Sponge, lap, and needle counts were correct at closure and at the conclusion of the case.               Caroleen Hamman, MD, FACS

## 2016-12-25 NOTE — Plan of Care (Signed)
Pt went for lap choley today. Pt returned to unit around 1445. Pain has been manageable so far.

## 2016-12-25 NOTE — Progress Notes (Signed)
15 minute call to floor. 

## 2016-12-25 NOTE — Anesthesia Postprocedure Evaluation (Signed)
Anesthesia Post Note  Patient: Craig Ayala  Procedure(s) Performed: LAPAROSCOPIC CHOLECYSTECTOMY (N/A )  Patient location during evaluation: PACU Anesthesia Type: General Level of consciousness: awake and alert Pain management: pain level controlled Vital Signs Assessment: post-procedure vital signs reviewed and stable Respiratory status: spontaneous breathing, nonlabored ventilation, respiratory function stable and patient connected to nasal cannula oxygen Cardiovascular status: blood pressure returned to baseline and stable Postop Assessment: no apparent nausea or vomiting Anesthetic complications: no     Last Vitals:  Vitals:   12/25/16 1432 12/25/16 1445  BP: (!) 150/71 (!) 150/75  Pulse: 85 83  Resp: 16   Temp:    SpO2: 96%     Last Pain:  Vitals:   12/25/16 1432  TempSrc:   PainSc: 3                  Precious Haws [place]

## 2016-12-25 NOTE — Transfer of Care (Signed)
Immediate Anesthesia Transfer of Care Note  Patient: Craig Ayala  Procedure(s) Performed: LAPAROSCOPIC CHOLECYSTECTOMY (N/A )  Patient Location: PACU  Anesthesia Type:General  Level of Consciousness: sedated  Airway & Oxygen Therapy: Patient Spontanous Breathing and Patient connected to face mask oxygen  Post-op Assessment: Report given to RN and Post -op Vital signs reviewed and stable  Post vital signs: Reviewed and stable  Last Vitals:  Vitals:   12/25/16 0402 12/25/16 0407  BP: (!) 158/99 (!) 152/72  Pulse: 67 63  Resp: 20   Temp: 36.8 C   SpO2: 93%     Last Pain:  Vitals:   12/25/16 0402  TempSrc: Oral  PainSc:       Patients Stated Pain Goal: 0 (78/41/28 2081)  Complications: No apparent anesthesia complications

## 2016-12-25 NOTE — Anesthesia Procedure Notes (Signed)
Procedure Name: Intubation Date/Time: 12/25/2016 12:47 PM Performed by: Nelda Marseille, CRNA Pre-anesthesia Checklist: Patient identified, Patient being monitored, Timeout performed, Emergency Drugs available and Suction available Patient Re-evaluated:Patient Re-evaluated prior to induction Oxygen Delivery Method: Circle system utilized Preoxygenation: Pre-oxygenation with 100% oxygen Induction Type: IV induction Ventilation: Mask ventilation without difficulty Laryngoscope Size: Mac, 3 and McGraph Grade View: Grade III Tube type: Oral Tube size: 7.5 mm Number of attempts: 1 Airway Equipment and Method: Stylet and Video-laryngoscopy Placement Confirmation: ETT inserted through vocal cords under direct vision,  positive ETCO2 and breath sounds checked- equal and bilateral Secured at: 21 cm Tube secured with: Tape Dental Injury: Teeth and Oropharynx as per pre-operative assessment  Difficulty Due To: Difficulty was anticipated, Difficult Airway- due to large tongue and Difficult Airway- due to anterior larynx

## 2016-12-25 NOTE — Progress Notes (Signed)
The risks, benefits, complications, treatment options, and expected outcomes were discussed with the patient. The possibilities of bleeding, recurrent infection, finding a normal gallbladder, perforation of viscus organs, damage to surrounding structures, bile leak, abscess formation, needing a drain placed, the need for additional procedures, reaction to medication, pulmonary aspiration,  failure to diagnose a condition, the possible need to convert to an open procedure, and creating a complication requiring transfusion or operation were discussed with the patient. The patient and/or family concurred with the proposed plan, giving informed consent.  

## 2016-12-25 NOTE — Anesthesia Post-op Follow-up Note (Signed)
Anesthesia QCDR form completed.        

## 2016-12-25 NOTE — Progress Notes (Signed)
Pt. Tolerated ice chips

## 2016-12-25 NOTE — Anesthesia Preprocedure Evaluation (Signed)
Anesthesia Evaluation  Patient identified by MRN, date of birth, ID band Patient awake    Reviewed: Allergy & Precautions, H&P , NPO status , Patient's Chart, lab work & pertinent test results  History of Anesthesia Complications Negative for: history of anesthetic complications  Airway Mallampati: III  TM Distance: <3 FB Neck ROM: limited    Dental  (+) Chipped, Poor Dentition, Caps, Missing   Pulmonary neg shortness of breath, former smoker,           Cardiovascular Exercise Tolerance: Good (-) angina(-) Past MI and (-) DOE negative cardio ROS       Neuro/Psych negative psych ROS   GI/Hepatic Neg liver ROS, PUD, GERD  Medicated and Controlled,  Endo/Other  negative endocrine ROS  Renal/GU Renal disease     Musculoskeletal   Abdominal   Peds  Hematology negative hematology ROS (+)   Anesthesia Other Findings Past Medical History: No date: Aneurysmal subarachnoid hemorrhage (HCC) No date: Cancer ([place])     Comment:  squamous cell No date: Gout No date: Kidney stone No date: Peptic ulcer No date: Pilonidal cyst No date: Spontaneous pneumothorax  Past Surgical History: No date: UPPER GASTROINTESTINAL ENDOSCOPY  BMI    Body Mass Index:  38.28 kg/m      Reproductive/Obstetrics negative OB ROS                             Anesthesia Physical Anesthesia Plan  ASA: III  Anesthesia Plan: General ETT   Post-op Pain Management:    Induction: Intravenous  PONV Risk Score and Plan: 3 and Ondansetron, Dexamethasone and Midazolam  Airway Management Planned: Oral ETT  Additional Equipment:   Intra-op Plan:   Post-operative Plan: Extubation in OR  Informed Consent: I have reviewed the patients History and Physical, chart, labs and discussed the procedure including the risks, benefits and alternatives for the proposed anesthesia with the patient or authorized representative who  has indicated his/her understanding and acceptance.   Dental Advisory Given  Plan Discussed with: Anesthesiologist, CRNA and Surgeon  Anesthesia Plan Comments: (Patient consented for risks of anesthesia including but not limited to:  - adverse reactions to medications - damage to teeth, lips or other oral mucosa - sore throat or hoarseness - Damage to heart, brain, lungs or loss of life  Patient voiced understanding.)        Anesthesia Quick Evaluation

## 2016-12-26 ENCOUNTER — Encounter: Payer: Self-pay | Admitting: Surgery

## 2016-12-26 DIAGNOSIS — K810 Acute cholecystitis: Secondary | ICD-10-CM | POA: Diagnosis not present

## 2016-12-26 LAB — COMPREHENSIVE METABOLIC PANEL
ALT: 80 U/L — ABNORMAL HIGH (ref 17–63)
AST: 66 U/L — AB (ref 15–41)
Albumin: 3.4 g/dL — ABNORMAL LOW (ref 3.5–5.0)
Alkaline Phosphatase: 55 U/L (ref 38–126)
Anion gap: 8 (ref 5–15)
BUN: 19 mg/dL (ref 6–20)
CHLORIDE: 102 mmol/L (ref 101–111)
CO2: 25 mmol/L (ref 22–32)
Calcium: 8.6 mg/dL — ABNORMAL LOW (ref 8.9–10.3)
Creatinine, Ser: 1.09 mg/dL (ref 0.61–1.24)
GFR calc Af Amer: >60 mL/min (ref >60)
GFR, EST NON AFRICAN AMERICAN: 60 mL/min — AB (ref >60)
Glucose, Bld: 148 mg/dL — ABNORMAL HIGH (ref 65–99)
POTASSIUM: 3.9 mmol/L (ref 3.5–5.1)
SODIUM: 135 mmol/L (ref 135–145)
Total Bilirubin: 1.1 mg/dL (ref 0.3–1.2)
Total Protein: 6.3 g/dL — ABNORMAL LOW (ref 6.5–8.1)

## 2016-12-26 LAB — CBC
HEMATOCRIT: 38.6 % — AB (ref 40.0–52.0)
Hemoglobin: 13.5 g/dL (ref 13.0–18.0)
MCH: 32.9 pg (ref 26.0–34.0)
MCHC: 34.9 g/dL (ref 32.0–36.0)
MCV: 94.2 fL (ref 80.0–100.0)
Platelets: 162 10*3/uL (ref 150–440)
RBC: 4.1 MIL/uL — AB (ref 4.40–5.90)
RDW: 13.9 % (ref 11.5–14.5)
WBC: 9.6 10*3/uL (ref 3.8–10.6)

## 2016-12-26 MED ORDER — OXYCODONE HCL 5 MG PO TABS: 5.0000 mg | ORAL_TABLET | ORAL | 0 refills | Status: DC | PRN

## 2016-12-26 NOTE — Progress Notes (Signed)
Discharge instructions reviewed with the patient.  rx for oxycodone given to the patient

## 2016-12-26 NOTE — Care Management Obs Status (Signed)
[facility] NOTIFICATION   Patient Details  Name: Craig Ayala MRN: [id number] Date of Birth: 1929-10-29   Medicare Observation Status Notification Given:  Yes    Beverly Sessions, RN 12/26/2016, 10:02 AM

## 2016-12-26 NOTE — Discharge Summary (Signed)
Physician Discharge Summary  Patient ID: LANELL DUBIE MRN: [id number] DOB/AGE: 09/03/1929 83 y.o.  Admit date: 12/24/2016 Discharge date: 12/26/2016  Admission Diagnoses:  Discharge Diagnoses:  Active Problems:   Cholecystitis, acute   Discharged Condition: good  Hospital Course: 83 y.o. male presented to [facility] ED for abdominal pain. Workup was found to be significant for imaging demonstrating acute cholecystitis. Informed consent was obtained and documented, and patient underwent uneventful laparoscopic cholecystectomy ([place], 12/25/2016).  Post-operatively, patient's pain improved/resolved and advancement of patient's diet and ambulation were well-tolerated. The remainder of patient's hospital course was essentially unremarkable, and discharge planning was initiated accordingly with patient safely able to be discharged home with appropriate discharge instructions, pain control, and outpatient surgical follow-up after all of his questions were answered to his expressed satisfaction.  Consults: None  Significant Diagnostic Studies: radiology: CT scan: acute cholecystitis and Ultrasound: acute cholecystitis  Treatments: surgery: laparoscopic cholecystectomy ([place], 12/25/2016)  Discharge Exam: Blood pressure 136/72, pulse 80, temperature 98.8 F (37.1 C), temperature source Oral, resp. rate 20, height 5\' 4"  (1.626 m), weight 223 lb (101.2 kg), SpO2 91 %. General appearance: alert, cooperative, no distress and appears younger than stated age GI: abdomen soft and non-distended with minimal peri-incisional tenderness to palpation, post-surgical incisional sites well-approximated without surrounding erythema or drainage  Disposition: 01-Home or Self Care   Allergies as of 12/26/2016   No Known Allergies     Medication List    TAKE these medications   aspirin EC 81 MG tablet Take 81 mg by mouth daily.   D3 ADULT PO Take 1 tablet by mouth daily.   Fish Oil 1200 MG Caps Take  1,200 mg by mouth 2 (two) times daily.   HM IRON SLOW RELEASE 142 (45 Fe) MG Tbcr Generic drug:  Ferrous Sulfate Take 45 mg by mouth daily.   omeprazole 20 MG capsule Commonly known as:  PRILOSEC Take 20 mg by mouth daily.   oxyCODONE 5 MG immediate release tablet Commonly known as:  Oxy IR/ROXICODONE Take 1-2 tablets (5-10 mg total) by mouth every 4 (four) hours as needed for severe pain.      Follow-up Information    Jules Husbands, MD. Go on 01/09/2017.   Specialty:  General Surgery Why:  Monday at 9:15am for hospital follow-up Contact information: [place] [address] [phone]           Signed: Vickie Epley 12/26/2016, 11:46 AM

## 2016-12-26 NOTE — Discharge Instructions (Signed)
In addition to included general post-operative instructions for Laparoscopic Cholecystectomy,  Diet: Gradually resume heart healthy diet with no absolute restrictions, but higher fat foods may cause loose BM's x weeks/months while your body adjusts to not having a gallbladder.  Activity: No heavy lifting >20 pounds (children, pets, laundry, garbage) or strenuous activity until follow-up, but light activity and walking are encouraged. Do not drive or drink alcohol if taking narcotic pain medications.  Wound care: 2 days after surgery (Tuesday, 11/27), may shower/get incision wet with soapy water and pat dry (do not rub incisions), but no baths or submerging incision underwater until follow-up.   Medications: Resume all home medications. For mild to moderate pain: acetaminophen (Tylenol) or ibuprofen (if no kidney disease). Combining Tylenol with alcohol can substantially increase your risk of causing liver disease. Narcotic pain medications, if prescribed, can be used for severe pain, though may cause nausea, constipation, and drowsiness. Do not combine Tylenol and Percocet (or similar) within a 6 hour period as Percocet (and similar) contain(s) Tylenol. If you do not need the narcotic pain medication, you do not need to fill the prescription.  Call office [phone]) at any time if any questions, worsening pain, fevers/chills, bleeding, drainage from incision site, or other concerns.

## 2016-12-27 LAB — SURGICAL PATHOLOGY

## 2017-01-03 ENCOUNTER — Other Ambulatory Visit: Payer: Self-pay

## 2017-01-03 DIAGNOSIS — I10 Essential (primary) hypertension: Secondary | ICD-10-CM | POA: Insufficient documentation

## 2017-01-03 DIAGNOSIS — N200 Calculus of kidney: Secondary | ICD-10-CM | POA: Insufficient documentation

## 2017-01-03 DIAGNOSIS — K259 Gastric ulcer, unspecified as acute or chronic, without hemorrhage or perforation: Secondary | ICD-10-CM | POA: Insufficient documentation

## 2017-01-03 DIAGNOSIS — C61 Malignant neoplasm of prostate: Secondary | ICD-10-CM | POA: Insufficient documentation

## 2017-01-03 DIAGNOSIS — E785 Hyperlipidemia, unspecified: Secondary | ICD-10-CM | POA: Insufficient documentation

## 2017-01-03 DIAGNOSIS — I729 Aneurysm of unspecified site: Secondary | ICD-10-CM | POA: Insufficient documentation

## 2017-01-03 DIAGNOSIS — M109 Gout, unspecified: Secondary | ICD-10-CM | POA: Insufficient documentation

## 2017-01-03 DIAGNOSIS — J984 Other disorders of lung: Secondary | ICD-10-CM | POA: Insufficient documentation

## 2017-01-09 ENCOUNTER — Encounter: Payer: Medicare Other | Admitting: Surgery

## 2017-01-09 ENCOUNTER — Encounter: Payer: Self-pay | Admitting: Surgery

## 2017-01-10 ENCOUNTER — Encounter: Payer: Self-pay | Admitting: Surgery

## 2017-01-10 ENCOUNTER — Ambulatory Visit (INDEPENDENT_AMBULATORY_CARE_PROVIDER_SITE_OTHER): Payer: Medicare Other | Admitting: Surgery

## 2017-01-10 VITALS — BP 163/95 | HR 63 | Temp 98.3°F | Wt 214.0 lb

## 2017-01-10 DIAGNOSIS — Z09 Encounter for follow-up examination after completed treatment for conditions other than malignant neoplasm: Secondary | ICD-10-CM

## 2017-01-10 NOTE — Progress Notes (Signed)
S/p lap chole Path d/w pt in detail No pain, taking Po  PE NAD Abd: soft, nt, incisions c/d/i  A/p Doing well RTC prn No heavy lifting

## 2017-01-10 NOTE — Patient Instructions (Signed)
Please give us a call in case you have any questions or concerns.  

## 2018-06-05 ENCOUNTER — Ambulatory Visit
Admission: RE | Admit: 2018-06-05 | Discharge: 2018-06-05 | Disposition: A | Discharge status: Home / Self Care | Payer: Medicare Other | Source: Ambulatory Visit | Attending: Internal Medicine | Admitting: Internal Medicine

## 2018-06-05 ENCOUNTER — Other Ambulatory Visit: Payer: Self-pay

## 2018-06-05 ENCOUNTER — Other Ambulatory Visit: Payer: Self-pay | Admitting: Internal Medicine

## 2018-06-05 DIAGNOSIS — M79602 Pain in left arm: Secondary | ICD-10-CM | POA: Diagnosis not present

## 2019-09-23 IMAGING — US UPPER LEFT VENOUS EXTREMITY ULTRASOUND
1 series · 14 of 24 positions shown · non-contrast
Comparison: MR shoulder 06/14/2014

CLINICAL DATA: Pain post injury x3 weeks

EXAM:
LEFT UPPER EXTREMITY VENOUS DOPPLER ULTRASOUND
TECHNIQUE: Gray-scale sonography with graded compression, as well as color
Doppler and duplex ultrasound were performed to evaluate the upper
extremity deep venous system from the level of the subclavian vein
and including the jugular, axillary, basilic and upper cephalic
vein. Spectral Doppler was utilized to evaluate flow at rest and
with distal augmentation maneuvers.

[Series 1: upper left venous extremity ultrasound · 0.08mm/px · 56 acquisitions, 14 frames shown]
[im 1/56]
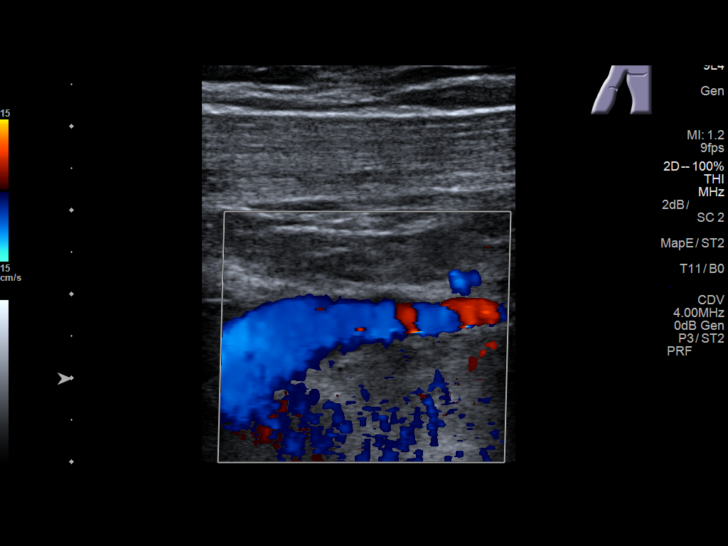
[im 5/56]
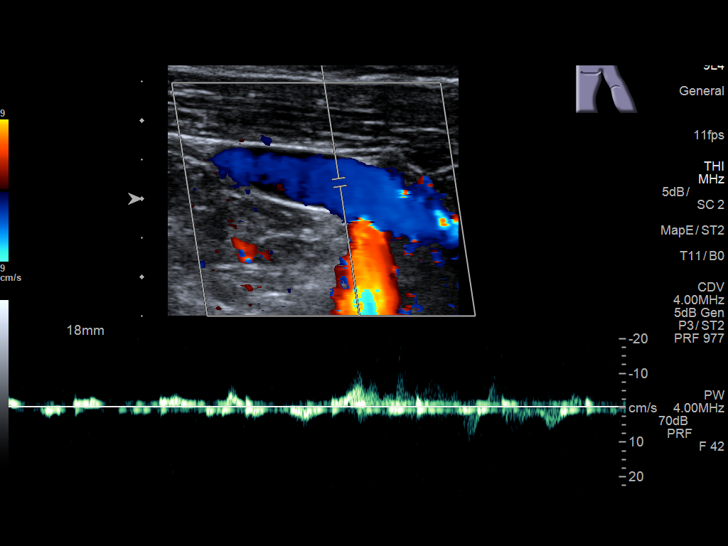
[im 10/56]
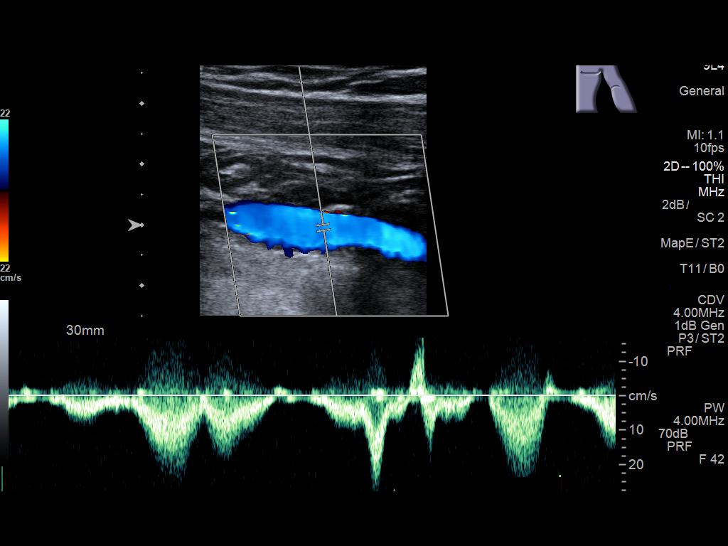
[im 15/56]
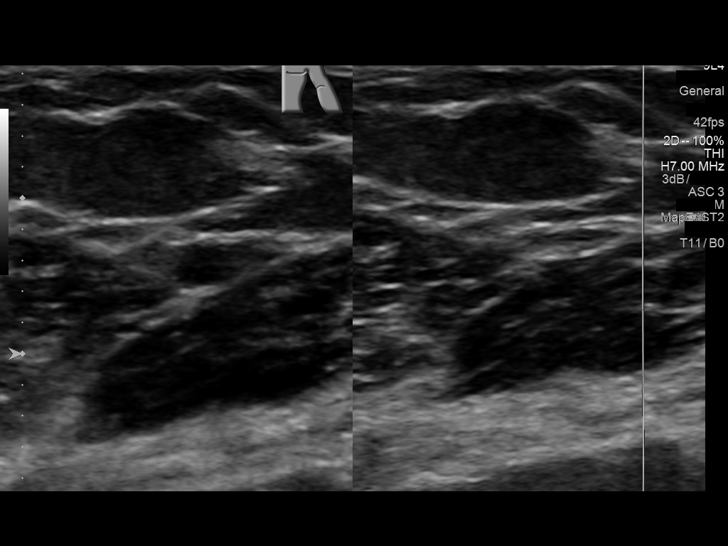
[im 17/56]
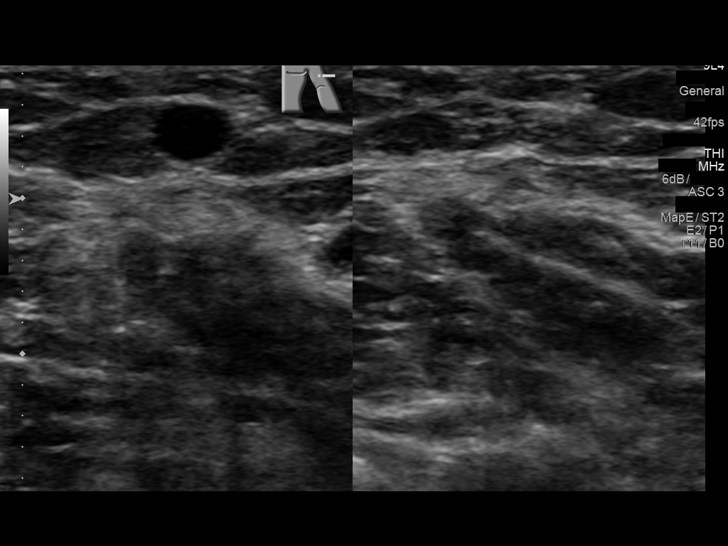
[im 22/56]
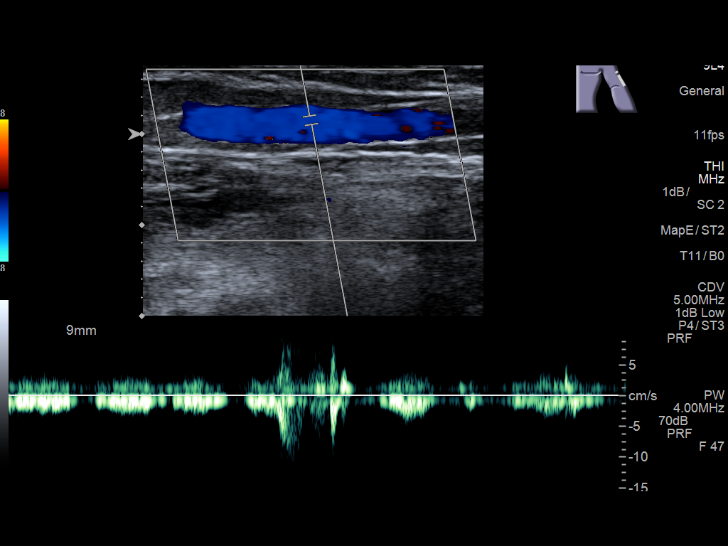
[im 27/56]
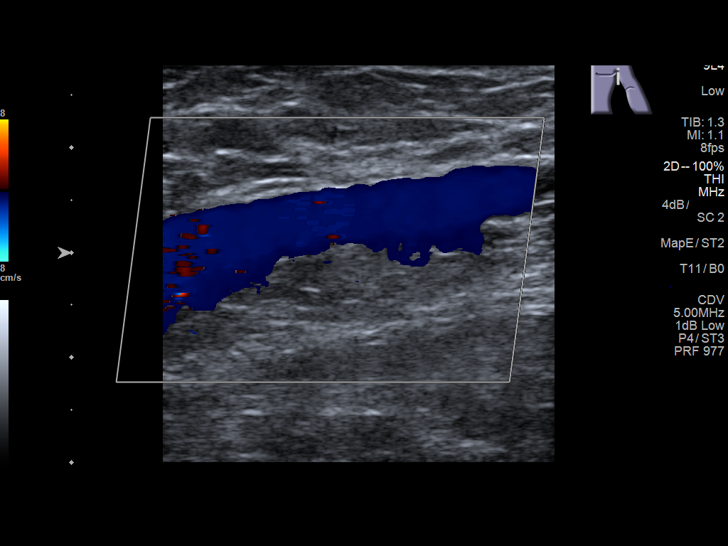
[im 32/56]
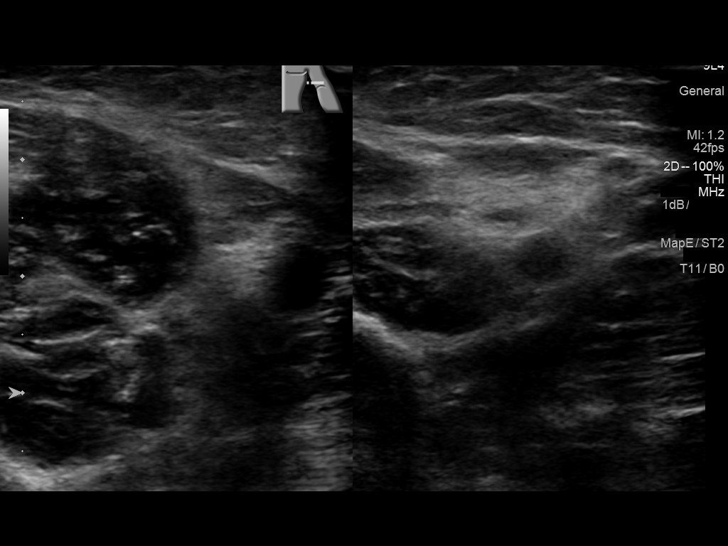
[im 36/56]
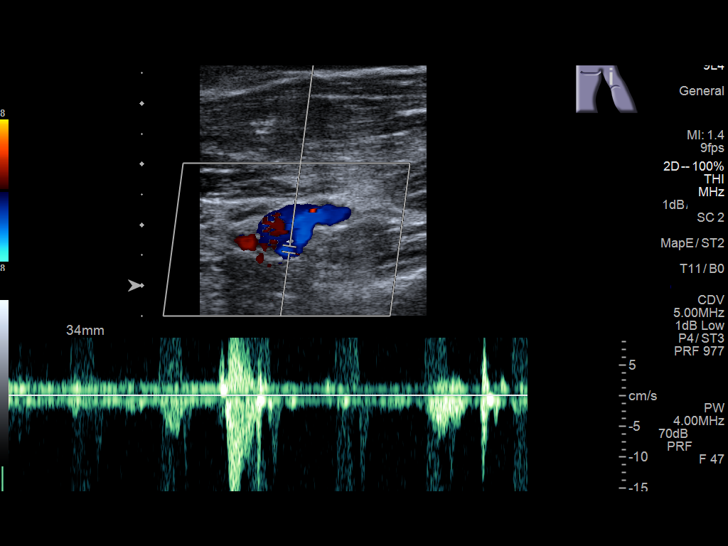
[im 41/56]
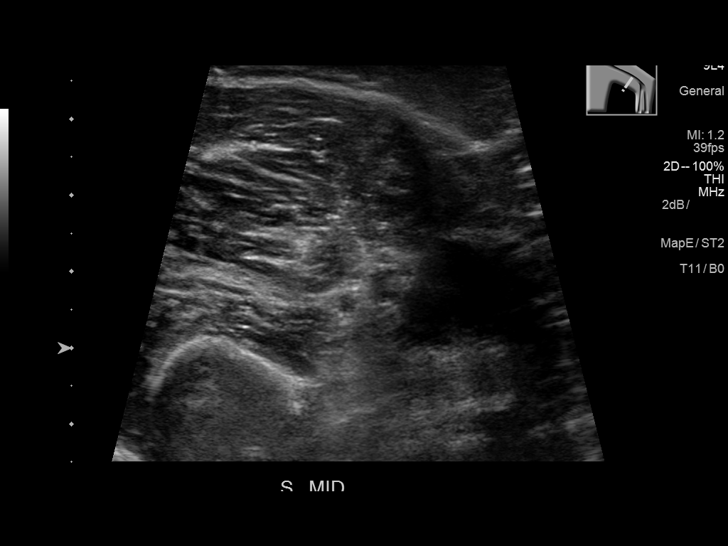
[im 46/56]
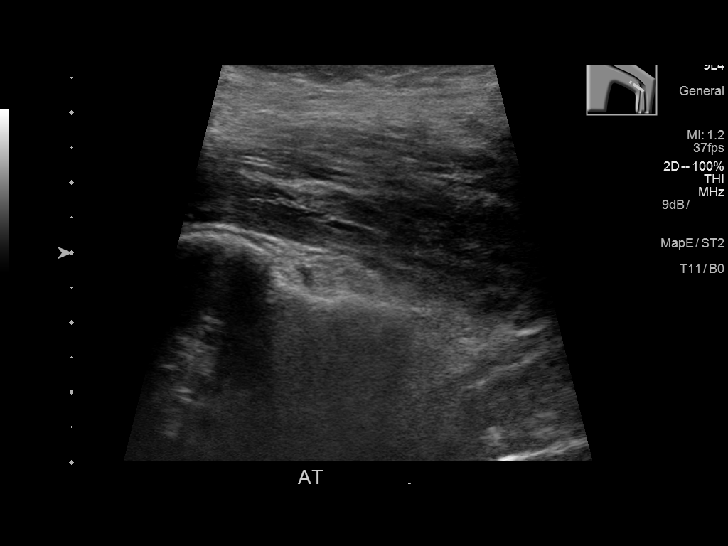
[im 48/56]
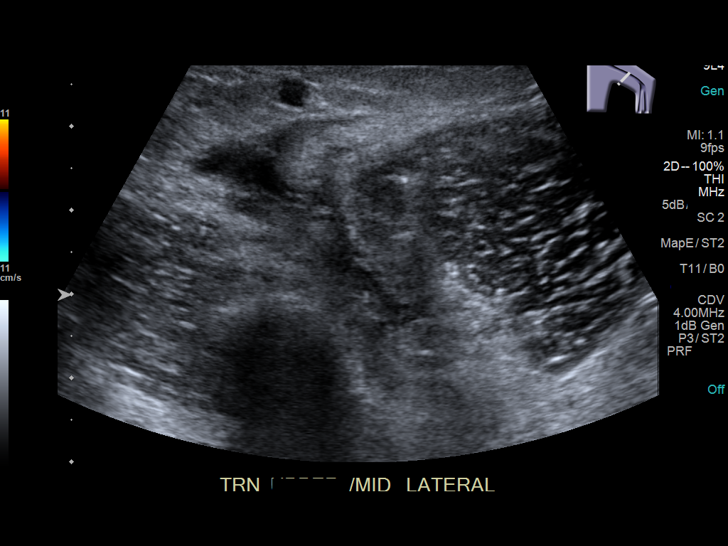
[im 53/56]
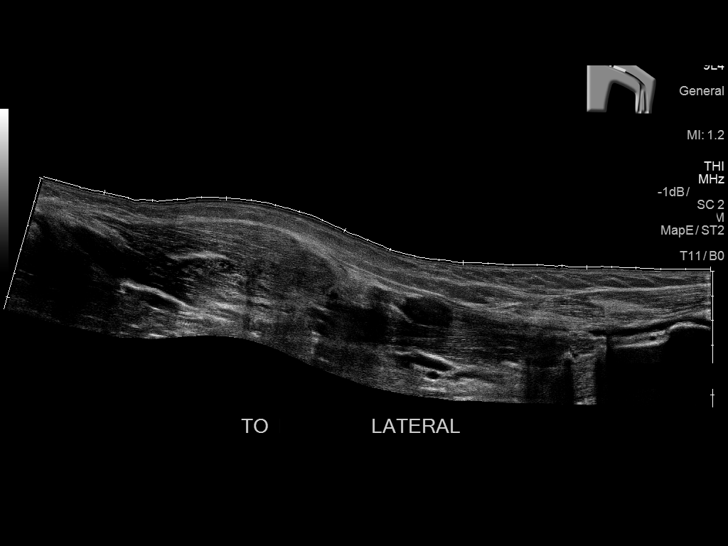
[im 56/56]
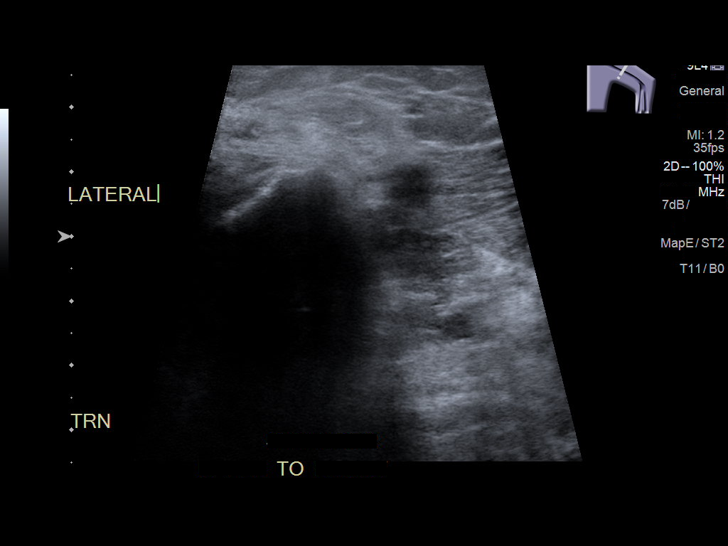

[14 of 24 positions shown; findings below may reference images not displayed]

FINDINGS: Thrombus within deep veins:  None visualized.

Compressibility of deep veins:  Normal.

Duplex waveform respiratory phasicity:  Normal.

Duplex waveform response to augmentation:  Normal.

Venous reflux:  None visualized.

Other findings: Limited views of the contralateral right subclavian
vein are unremarkable.

Focal deep soft tissue swelling in the upper arm in the region of
palpable concern.
IMPRESSION: 1. Negative for left upper extremity DVT.

## 2020-07-31 ENCOUNTER — Ambulatory Visit
Admission: RE | Admit: 2020-07-31 | Discharge: 2020-07-31 | Disposition: A | Discharge status: Home / Self Care | Payer: Medicare Other | Source: Ambulatory Visit | Attending: Urology | Admitting: Urology

## 2020-07-31 ENCOUNTER — Ambulatory Visit (INDEPENDENT_AMBULATORY_CARE_PROVIDER_SITE_OTHER): Payer: Medicare Other | Admitting: Urology

## 2020-07-31 ENCOUNTER — Encounter: Payer: Self-pay | Admitting: Urology

## 2020-07-31 ENCOUNTER — Other Ambulatory Visit: Payer: Self-pay

## 2020-07-31 VITALS — BP 129/80 | HR 64 | Ht 73.0 in | Wt 220.0 lb

## 2020-07-31 DIAGNOSIS — R399 Unspecified symptoms and signs involving the genitourinary system: Secondary | ICD-10-CM | POA: Insufficient documentation

## 2020-07-31 DIAGNOSIS — N200 Calculus of kidney: Secondary | ICD-10-CM | POA: Insufficient documentation

## 2020-07-31 DIAGNOSIS — R3915 Urgency of urination: Secondary | ICD-10-CM | POA: Diagnosis not present

## 2020-07-31 DIAGNOSIS — R351 Nocturia: Secondary | ICD-10-CM

## 2020-07-31 DIAGNOSIS — Z8546 Personal history of malignant neoplasm of prostate: Secondary | ICD-10-CM

## 2020-07-31 LAB — URINALYSIS, COMPLETE
Bilirubin, UA: NEGATIVE
Glucose, UA: NEGATIVE
Ketones, UA: NEGATIVE
Leukocytes,UA: NEGATIVE
Nitrite, UA: NEGATIVE
Protein,UA: NEGATIVE
RBC, UA: NEGATIVE
Specific Gravity, UA: 1.015 (ref 1.005–1.030)
Urobilinogen, Ur: 0.2 mg/dL (ref 0.2–1.0)
pH, UA: 6 (ref 5.0–7.5)

## 2020-07-31 MED ORDER — MIRABEGRON ER 50 MG PO TB24: 50.0000 mg | ORAL_TABLET | Freq: Every day | ORAL | 0 refills | Status: DC

## 2020-07-31 NOTE — Progress Notes (Signed)
   07/31/2020 8:45 AM   Craig Ayala May 03, 1929 [id number]  Referring provider: Idelle Crouch, MD [facility] [place],  [place] [address]  Chief Complaint  Patient presents with   New Patient (Initial Visit)    BPH    HPI: Craig Ayala is an 87 y.o. male referred for evaluation of lower urinary tract symptoms.  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 10/2003 PSA has remained low and was 0.05 05/2020 Bilateral nonobstructing renal calculi on CT 2012   PMH: Past Medical History:  Diagnosis Date   Aneurysmal subarachnoid hemorrhage (HCC)    Cancer ([place])    squamous cell   Gout    Kidney stone    Peptic ulcer    Pilonidal cyst    Spontaneous pneumothorax     Surgical History: Past Surgical History:  Procedure Laterality Date   CHOLECYSTECTOMY N/A 12/25/2016   Procedure: LAPAROSCOPIC CHOLECYSTECTOMY;  Surgeon: Jules Husbands, MD;  Location: [facility] ORS;  Service: General;  Laterality: N/A;   UPPER GASTROINTESTINAL ENDOSCOPY      Home Medications:  Allergies as of 07/31/2020   No Known Allergies      Medication List        Accurate as of July 31, 2020  8:45 AM. If you have any questions, ask your nurse or doctor.          aspirin EC 81 MG tablet Take 81 mg by mouth daily.   D3 ADULT PO Take 1 tablet by mouth daily.   Ferrous Sulfate 142 (45 Fe) MG Tbcr Take 45 mg by mouth daily.   Fish Oil 1200 MG Caps Take 1,200 mg by mouth 2 (two) times daily.   omeprazole 20 MG capsule Commonly known as: PRILOSEC Take 20 mg by mouth  daily.        Allergies: No Known Allergies  Family History: No family history on file.  Social History:  reports that he has quit smoking. He has never used smokeless tobacco. He reports that he does not drink alcohol. No history on file for drug use.   Physical Exam: BP 129/80   Pulse 64   Ht 6\' 1"  (1.854 m)   Wt 220 lb (99.8 kg)   BMI 29.03 kg/m   Constitutional:  Alert and oriented, No acute distress. HEENT: [place] AT, moist mucus membranes.  Trachea midline, no masses. Cardiovascular: No clubbing, cyanosis, or edema. Respiratory: Normal respiratory effort, no increased work of breathing.  Laboratory Data:  Urinalysis Dipstick/microscopy negative  Assessment & Plan:    1.  Lower urinary tract symptoms With prior history of radiation therapy BPH would be less likely Most likely overactive bladder Trial Myrbetriq 50 mg daily-samples given Bladder scan PVR 76 mL Follow-up 1 month for reassessment and if no improvement will perform cystoscopy at that visit to evaluate for stricture/bladder neck contracture  2.  Bilateral nephrolithiasis KUB ordered  3.  Personal history of prostate cancer PSA remains low and stable >15 years posttreatment   Abbie Sons, MD  [place] [address] [place], [place] [address] [phone]

## 2020-07-31 NOTE — Patient Instructions (Signed)
Cystoscopy Cystoscopy is a procedure that is used to help diagnose and sometimes treat conditions that affect the lower urinary tract. The lower urinary tract includes the bladder and the urethra. The urethra is the tube that drains urine from the bladder. Cystoscopy is done using a thin, tube-shaped instrument with a light and camera at the end (cystoscope). The cystoscope may be hard or flexible, depending on the goal of the procedure. The cystoscope is inserted through the urethra, into the bladder. Cystoscopy may be recommended if you have: Urinary tract infections that keep coming back. Blood in the urine (hematuria). An inability to control when you urinate (urinary incontinence) or an overactive bladder. Unusual cells found in a urine sample. A blockage in the urethra, such as a urinary stone. Painful urination. An abnormality in the bladder found during an intravenous pyelogram (IVP) or CT scan. Cystoscopy may also be done to remove a sample of tissue to be examined under a microscope (biopsy). What are the risks? Generally, this is a safe procedure. However, problems may occur, including: Infection. Bleeding.  What happens during the procedure?  You will be given one or more of the following: A medicine to numb the area (local anesthetic). The area around the opening of your urethra will be cleaned. The cystoscope will be passed through your urethra into your bladder. Germ-free (sterile) fluid will flow through the cystoscope to fill your bladder. The fluid will stretch your bladder so that your health care provider can clearly examine your bladder walls. Your doctor will look at the urethra and bladder. The cystoscope will be removed The procedure may vary among health care providers  What can I expect after the procedure? After the procedure, it is common to have: Some soreness or pain in your abdomen and urethra. Urinary symptoms. These include: Mild pain or burning when you  urinate. Pain should stop within a few minutes after you urinate. This may last for up to 1 week. A small amount of blood in your urine for several days. Feeling like you need to urinate but producing only a small amount of urine. Follow these instructions at home: General instructions Return to your normal activities as told by your health care provider.  Do not drive for 24 hours if you were given a sedative during your procedure. Watch for any blood in your urine. If the amount of blood in your urine increases, call your health care provider. If a tissue sample was removed for testing (biopsy) during your procedure, it is up to you to get your test results. Ask your health care provider, or the department that is doing the test, when your results will be ready. Drink enough fluid to keep your urine pale yellow. Keep all follow-up visits as told by your health care provider. This is important. Contact a health care provider if you: Have pain that gets worse or does not get better with medicine, especially pain when you urinate. Have trouble urinating. Have more blood in your urine. Get help right away if you: Have blood clots in your urine. Have abdominal pain. Have a fever or chills. Are unable to urinate. Summary Cystoscopy is a procedure that is used to help diagnose and sometimes treat conditions that affect the lower urinary tract. Cystoscopy is done using a thin, tube-shaped instrument with a light and camera at the end. After the procedure, it is common to have some soreness or pain in your abdomen and urethra. Watch for any blood in your urine.   If the amount of blood in your urine increases, call your health care provider. If you were prescribed an antibiotic medicine, take it as told by your health care provider. Do not stop taking the antibiotic even if you start to feel better. This information is not intended to replace advice given to you by your health care provider. Make  sure you discuss any questions you have with your health care provider. Document Revised: 01/09/2018 Document Reviewed: 01/09/2018 Elsevier Patient Education  2020 Elsevier Inc.  

## 2020-08-03 ENCOUNTER — Encounter: Payer: Self-pay | Admitting: Urology

## 2020-08-04 ENCOUNTER — Encounter: Payer: Self-pay | Admitting: *Deleted

## 2020-08-07 ENCOUNTER — Other Ambulatory Visit: Payer: Self-pay | Admitting: Urology

## 2020-09-02 ENCOUNTER — Telehealth: Payer: Self-pay | Admitting: Urology

## 2020-09-02 NOTE — Telephone Encounter (Signed)
If he feels like the Myrbetriq is helping his urinary symptoms then can send in an Rx.  If he did not see any significant improvement then would discontinue

## 2020-09-02 NOTE — Telephone Encounter (Signed)
Patient cx his cysto for Friday and wants to know if he still needs to take  Myrbetriq 50 mg? He said if he does then he would need a script called in. He did not want to reschedule the cysto appt

## 2020-09-03 MED ORDER — MIRABEGRON ER 50 MG PO TB24: 50.0000 mg | ORAL_TABLET | Freq: Every day | ORAL | 3 refills | Status: DC

## 2020-09-03 NOTE — Addendum Note (Signed)
Addended by: Kyra Manges on: 09/03/2020 02:58 PM   Modules accepted: Orders

## 2020-09-03 NOTE — Telephone Encounter (Signed)
Patient states he will continue it a little while longer. RX sent to pharmacy.

## 2020-09-04 ENCOUNTER — Other Ambulatory Visit: Payer: Self-pay | Admitting: Urology

## 2020-10-11 ENCOUNTER — Encounter: Payer: Self-pay | Admitting: Urology

## 2020-10-29 ENCOUNTER — Encounter: Payer: Self-pay | Admitting: Urology

## 2020-10-29 ENCOUNTER — Other Ambulatory Visit: Payer: Self-pay

## 2020-10-29 ENCOUNTER — Ambulatory Visit (INDEPENDENT_AMBULATORY_CARE_PROVIDER_SITE_OTHER): Payer: Medicare Other | Admitting: Urology

## 2020-10-29 VITALS — BP 162/80 | HR 82 | Ht 73.0 in | Wt 220.0 lb

## 2020-10-29 DIAGNOSIS — N35812 Other urethral bulbous stricture, male: Secondary | ICD-10-CM | POA: Diagnosis not present

## 2020-10-29 DIAGNOSIS — R3915 Urgency of urination: Secondary | ICD-10-CM

## 2020-10-29 DIAGNOSIS — Z8546 Personal history of malignant neoplasm of prostate: Secondary | ICD-10-CM | POA: Diagnosis not present

## 2020-10-29 DIAGNOSIS — R399 Unspecified symptoms and signs involving the genitourinary system: Secondary | ICD-10-CM

## 2020-10-29 NOTE — Progress Notes (Signed)
   10/29/2020 9:20 AM   [name] [birth year] [id number]  Referring provider: Idelle Crouch, MD [facility] [place],  [place] [address]  Chief Complaint  Patient presents with   Cysto     HPI: 87 y.o. male with bothersome lower urinary tract symptoms.  Cystoscopy today with a <10 French bulbar stricture.  Cystoscopy with balloon dilation was recommended.   PMH: Past Medical History:  Diagnosis Date   Aneurysmal subarachnoid hemorrhage ([place])    Cancer (HCC)    squamous cell   Gout    Kidney stone    Peptic ulcer    Pilonidal cyst    Spontaneous pneumothorax     Surgical History: Past Surgical History:  Procedure Laterality Date   CHOLECYSTECTOMY N/A 12/25/2016   Procedure: LAPAROSCOPIC CHOLECYSTECTOMY;  Surgeon: Jules Husbands, MD;  Location: [facility] ORS;  Service: General;  Laterality: N/A;   UPPER GASTROINTESTINAL ENDOSCOPY      Home Medications:  Allergies as of 10/29/2020   No Known Allergies      Medication List        Accurate as of October 29, 2020  9:20 AM. If you have any questions, ask your nurse or doctor.          aspirin EC 81 MG tablet Take 81 mg by mouth daily.   bisoprolol-hydrochlorothiazide 10-6.25 MG tablet Commonly known as: ZIAC Take 1 tablet by mouth daily.   D3 ADULT PO Take 1 tablet by mouth daily.   Ferrous Sulfate 142 (45 Fe) MG Tbcr Take 45 mg by mouth daily.   Fish Oil 1200 MG Caps Take 1,200 mg by mouth 2 (two) times daily.   latanoprost 0.005 % ophthalmic solution Commonly known as: XALATAN 1 drop at bedtime.   mirabegron ER 50 MG Tb24 tablet Commonly known as: Myrbetriq Take 1 tablet (50 mg total) by mouth daily.   omeprazole 20 MG capsule Commonly known as: PRILOSEC Take 20 mg by mouth daily.        Allergies: No Known Allergies  Family History: No family history on file.  Social History:  reports that he has quit smoking. He has never used smokeless tobacco. He  reports that he does not drink alcohol. No history on file for drug use.   Physical Exam: BP (!) 162/80   Pulse 82   Ht 6\' 1"  (1.854 m)   Wt 220 lb (99.8 kg)   BMI 29.03 kg/m   Constitutional:  Alert and oriented, No acute distress. HEENT: [place] AT, moist mucus membranes.  Trachea midline, no masses. Cardiovascular: RRR Respiratory: Clear Psychiatric: Normal mood and affect.   Assessment & Plan:    1.  Bulbar urethral stricture Cystoscopy with balloon dilation (Optilume DBC) The procedure was discussed in detail including the need for a urethral catheter 48 hours postop and the possibility of bleeding, infection and recurrent stricture All questions were answered and he desires to schedule   Abbie Sons, MD  [place] [address] [place], [place] [address] [phone]

## 2020-10-29 NOTE — Progress Notes (Signed)
   10/29/20  CC:  Chief Complaint  Patient presents with   Cysto    HPI: Initially seen July 2022 for lower urinary tract symptoms.  Cystoscopy was scheduled and he was given a trial of Myrbetriq.  He thought his symptoms improved on Myrbetriq and canceled his cystoscopy however decided to reschedule  Blood pressure (!) 162/80, pulse 82, height 6\' 1"  (1.854 m), weight 220 lb (99.8 kg). NED. A&Ox3.   No respiratory distress   Abd soft, NT, ND Normal phallus with bilateral descended testicles  Cystoscopy Procedure Note  Patient identification was confirmed, informed consent was obtained, and patient was prepped using Betadine solution.  Lidocaine jelly was administered per urethral meatus.     Pre-Procedure: - Inspection reveals a normal caliber urethral meatus.  Procedure: The flexible cystoscope was introduced without difficulty - <10 French bulbar urethral stricture; unable to advance scope    Post-Procedure: - Patient tolerated the procedure well  Assessment/ Plan: Bulbar urethral stricture Schedule cystoscopy with balloon dilation (Optilume DBC)    Abbie Sons, MD

## 2020-10-30 LAB — MICROSCOPIC EXAMINATION: Bacteria, UA: NONE SEEN

## 2020-10-30 LAB — URINALYSIS, COMPLETE
Bilirubin, UA: NEGATIVE
Glucose, UA: NEGATIVE
Ketones, UA: NEGATIVE
Leukocytes,UA: NEGATIVE
Nitrite, UA: NEGATIVE
Protein,UA: NEGATIVE
RBC, UA: NEGATIVE
Specific Gravity, UA: 1.025 (ref 1.005–1.030)
Urobilinogen, Ur: 0.2 mg/dL (ref 0.2–1.0)
pH, UA: 5.5 (ref 5.0–7.5)

## 2020-11-01 ENCOUNTER — Other Ambulatory Visit: Payer: Self-pay | Admitting: Urology

## 2020-11-01 DIAGNOSIS — N35812 Other urethral bulbous stricture, male: Secondary | ICD-10-CM

## 2020-11-01 NOTE — Progress Notes (Signed)
Surgical Physician Order Form  ** Scheduling expectation :  Check with patient-needs to arrange care for his wife the day of surgery  *Length of Case: 30 minutes  *Clearance needed: no  *Anticoagulation Instructions: N/A  *Aspirin Instructions: Hold Aspirin  *Post-op visit Date/Instructions:   Cath removal 2-3 days with PA; 4-6-week follow-up with me  *Diagnosis: Urethral Stricture  *Procedure: Cysto w/urethral dilation (26834) Optilume DBC  -Admit type: OUTpatient  -Anesthesia: MAC  -VTE Prophylaxis Standing Order SCD's       Other:   -Standing Lab Orders Per Anesthesia    Lab other: UA&Urine Culture  -Standing Test orders EKG/Chest x-ray per Anesthesia       Test other:   - Medications:     Ancef 2gm IV   Other Instructions:

## 2020-11-02 ENCOUNTER — Encounter: Payer: Self-pay | Admitting: Urology

## 2020-11-02 ENCOUNTER — Other Ambulatory Visit: Payer: Self-pay

## 2020-11-02 ENCOUNTER — Ambulatory Visit (INDEPENDENT_AMBULATORY_CARE_PROVIDER_SITE_OTHER): Payer: Medicare Other | Admitting: Urology

## 2020-11-02 VITALS — BP 195/82 | HR 80 | Ht 75.0 in | Wt 220.0 lb

## 2020-11-02 DIAGNOSIS — N35812 Other urethral bulbous stricture, male: Secondary | ICD-10-CM | POA: Diagnosis not present

## 2020-11-02 MED ORDER — SULFAMETHOXAZOLE-TRIMETHOPRIM 800-160 MG PO TABS: 1.0000 | ORAL_TABLET | Freq: Two times a day (BID) | ORAL | 0 refills | Status: AC

## 2020-11-02 MED ORDER — SULFAMETHOXAZOLE-TRIMETHOPRIM 800-160 MG PO TABS
1.0000 | ORAL_TABLET | Freq: Once | ORAL | Status: AC
  Administered 2020-11-02: 1 via ORAL

## 2020-11-02 NOTE — Progress Notes (Signed)
Procedure Note  Diagnosis: 1.  Bulbous urethral stricture 2.  Urinary retention  Procedure: Complex catheter placement  Indications: 87 y.o. male with a history of progressively worsening voiding symptoms and urethroscopy last week remarkable for a bulbar stricture.  He called the office today stating he had been unable to urinate since 10 PM last night and is uncomfortable.  Description: External genitalia were prepped and draped.  A flexible cystoscope was lubricated, passed per urethra and advanced proximally to the level of the bulbar urethral stricture.  A 0.038 Sensor wire was then placed the cystoscope and advanced through the stricture.  The cystoscope was removed and the urethra was dilated with over-the-wire disposable dilators from 8-18 [place].  A 16 French [place] catheter was then placed over the wire without difficulty.  The balloon was inflated with 10 cc of sterile water.  700+ cc of clear urine was obtained  Plan: Indwelling catheter x1 week Follow-up 3 weeks after catheter removed for repeat cystoscopy schedule up Optilume if stricture recurring Rx Septra DS 1 p.o. twice daily x3 days  John Giovanni, MD

## 2020-11-08 NOTE — Progress Notes (Signed)
11/09/2020 3:14 PM   [name] 08/23/1929 [id number]  Referring provider: Idelle Crouch, MD [facility] [place],  [place] [address]  Chief Complaint  Patient presents with   Follow-up    Voiding trial   Urological history: 1. 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 Urethral stricture -bulbar urethral stricture -in-office dilation to 18 Fr on 11/02/2020  3. Nephrolithiasis -Bilateral nonobstructing renal calculi on CT 2012  HPI: Craig Ayala is a 87 y.o. male who presents today for catheter removal.  He presented to the office on 11/02/2020 after being unable to void since the night before.  He underwent in office dilation and a Foley catheter was placed.  Foley was removed this afternoon without difficulty.  He has been urinating today.  He is having some issues with spraying of the urinary stream and urge incontinence.  Patient denies any modifying or aggravating factors.  Patient denies any gross hematuria, dysuria or suprapubic/flank pain.  Patient denies any fevers, chills, nausea or vomiting.    PVR this afternoon is 0 mL.    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 If you have any questions, ask your nurse or doctor.          STOP taking these medications     mirabegron ER 50 MG Tb24 tablet Commonly known as: Myrbetriq Stopped by: Zara Council, PA-C       TAKE these medications    aspirin EC 81 MG tablet Take 81 mg by mouth daily.   bisoprolol-hydrochlorothiazide 5-6.25 MG tablet Commonly known as: ZIAC Take 1 tablet by mouth daily. What changed: Another medication with the same name was removed. Continue taking this medication, and follow the directions you see here. Changed by: Zara Council, PA-C   D3 ADULT PO Take 1 tablet by mouth daily.   Ferrous Sulfate 142 (45 Fe) MG Tbcr Take 45 mg by mouth daily.   Fish Oil 1200 MG Caps Take 1,200 mg by mouth 2 (two) times daily.   latanoprost 0.005 % ophthalmic solution Commonly known as: XALATAN 1 drop at bedtime.   omeprazole 20 MG capsule Commonly known as: PRILOSEC Take 20 mg by mouth daily.        Allergies: No Known Allergies  Family History: No family history on file.  Social History:  reports that he has quit smoking. He has never used smokeless tobacco. He reports that he does not drink alcohol. No history on file for drug use.  ROS: Pertinent ROS in HPI  Physical Exam: BP (!) 148/76   Pulse 74   Ht 6\' 1"  (1.854 m)   Wt 219 lb (99.3 kg)   BMI 28.89 kg/m  Constitutional:  Well nourished. Alert and oriented, No acute distress. HEENT: [place] AT, mask in place.  Trachea midline Cardiovascular: No clubbing, cyanosis, or edema. Respiratory: Normal respiratory effort, no increased work of breathing. GU: No CVA tenderness.  No bladder fullness or masses.  Patient with uncircumcised phallus. Foreskin easily retracted  Urethral meatus is patent.  Scrotum without lesions, cysts, rashes and/or edema.  Neurologic: Grossly intact, no focal deficits, moving all 4 extremities. Psychiatric: Normal mood and affect.  Laboratory Data: N/A   Pertinent Imaging: Results for Craig, Ayala (MRN [id number]) as of 11/09/2020 15:13  Ref. Range 11/09/2020 15:07  Scan  Result Unknown 11ml    Catheter Removal  Patient is present today for a catheter removal.  9 ml of water was drained from the balloon. A 16 FR Council tip foley cath was removed from the bladder no complications were noted . Patient tolerated well.  Performed by: Myself  Assessment & Plan:    1. Urethral stricture -Foley catheter removed today -explained to him that the urinary symptoms he is having now are likely related to the catheter being in place and the change of the bladder/urethra anatomy after dilation and this should improve as time goes on and to wear protective garments for accidents -advised him to notify us if he should experience dysuria, pain or gross heme -repeat cystoscopy in 3 weeks and schedule Optilume if stricture recurrence   Return for Keep appointment on the 28th with Dr. Bernardo Heater for cysto .  These notes generated with voice recognition software. I apologize for typographical errors.  Zara Council, PA-C  [facility] [address]  [place], [place] [address] [phone]

## 2020-11-09 ENCOUNTER — Ambulatory Visit: Payer: No Typology Code available for payment source | Admitting: Urology

## 2020-11-09 ENCOUNTER — Ambulatory Visit (INDEPENDENT_AMBULATORY_CARE_PROVIDER_SITE_OTHER): Payer: Medicare Other | Admitting: Urology

## 2020-11-09 ENCOUNTER — Encounter: Payer: Self-pay | Admitting: Urology

## 2020-11-09 ENCOUNTER — Other Ambulatory Visit: Payer: Self-pay

## 2020-11-09 VITALS — BP 148/76 | HR 74 | Ht 73.0 in | Wt 219.0 lb

## 2020-11-09 DIAGNOSIS — N35812 Other urethral bulbous stricture, male: Secondary | ICD-10-CM | POA: Diagnosis not present

## 2020-11-09 LAB — BLADDER SCAN AMB NON-IMAGING

## 2020-11-27 ENCOUNTER — Encounter: Payer: Self-pay | Admitting: Urology

## 2020-11-27 ENCOUNTER — Other Ambulatory Visit: Payer: Self-pay

## 2020-11-27 ENCOUNTER — Ambulatory Visit (INDEPENDENT_AMBULATORY_CARE_PROVIDER_SITE_OTHER): Payer: Medicare Other | Admitting: Urology

## 2020-11-27 VITALS — BP 135/71 | HR 67 | Ht 73.0 in | Wt 221.0 lb

## 2020-11-27 DIAGNOSIS — N35812 Other urethral bulbous stricture, male: Secondary | ICD-10-CM | POA: Diagnosis not present

## 2020-11-27 LAB — URINALYSIS, COMPLETE
Bilirubin, UA: NEGATIVE
Glucose, UA: NEGATIVE
Leukocytes,UA: NEGATIVE
Nitrite, UA: NEGATIVE
Protein,UA: NEGATIVE
Specific Gravity, UA: 1.025 (ref 1.005–1.030)
Urobilinogen, Ur: 0.2 mg/dL (ref 0.2–1.0)
pH, UA: 5.5 (ref 5.0–7.5)

## 2020-11-27 LAB — MICROSCOPIC EXAMINATION
Bacteria, UA: NONE SEEN
Epithelial Cells (non renal): NONE SEEN /hpf (ref 0–10)

## 2020-11-27 NOTE — Progress Notes (Signed)
   11/27/2020 6:54 PM   [name] 10/29/1929 [id number]  Referring provider: Idelle Crouch, MD [facility] [address],  [place] [address]  Chief Complaint  Patient presents with   Cysto    HPI: 86 y.o. male presents for follow-up visit.  s/p dilation small caliber bulbar stricture 11/02/2020.  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 If you have any questions, ask your nurse or doctor.          aspirin EC 81 MG tablet Take 81 mg by mouth daily.   bisoprolol-hydrochlorothiazide 5-6.25 MG tablet Commonly known as: ZIAC Take 1 tablet by mouth daily.   D3 ADULT PO Take 1 tablet by mouth daily.   Ferrous Sulfate 142 (45 Fe) MG Tbcr Take 45 mg by mouth daily.   Fish Oil 1200 MG Caps Take 1,200 mg by mouth 2 (two) times daily.   latanoprost 0.005 % ophthalmic solution Commonly known as: XALATAN 1 drop at bedtime.   omeprazole 20 MG capsule Commonly known as: PRILOSEC Take 20 mg by mouth daily.        Allergies: No Known Allergies  Family History: History reviewed. No pertinent family history.  Social History:  reports that he has quit smoking. He has never used smokeless  tobacco. He reports that he does not drink alcohol. No history on file for drug use.   Physical Exam: BP 135/71   Pulse 67   Ht 6\' 1"  (1.854 m)   Wt 221 lb (100.2 kg)   BMI 29.16 kg/m   Constitutional:  Alert and oriented, No acute distress. HEENT: [place] AT, moist mucus membranes.  Trachea midline, no masses. Cardiovascular: No clubbing, cyanosis, or edema. Psychiatric: Normal mood and affect.   Assessment & Plan:    1. Other stricture of bulbous urethra in male Doing well status post urethral dilation We discussed the likelihood of recurrent stricture disease Instructed to call for recurrent obstructive voiding symptoms Follow-up 4 months with PVR   Abbie Sons, MD  [place] [address], [place], [place] [address] [phone]

## 2021-03-31 ENCOUNTER — Encounter: Payer: Self-pay | Admitting: Urology

## 2021-03-31 ENCOUNTER — Other Ambulatory Visit: Payer: Self-pay

## 2021-03-31 ENCOUNTER — Ambulatory Visit (INDEPENDENT_AMBULATORY_CARE_PROVIDER_SITE_OTHER): Payer: Medicare Other | Admitting: Urology

## 2021-03-31 VITALS — BP 125/72 | HR 68 | Ht 73.0 in | Wt 220.0 lb

## 2021-03-31 DIAGNOSIS — R399 Unspecified symptoms and signs involving the genitourinary system: Secondary | ICD-10-CM

## 2021-03-31 DIAGNOSIS — N35812 Other urethral bulbous stricture, male: Secondary | ICD-10-CM | POA: Diagnosis not present

## 2021-03-31 LAB — BLADDER SCAN AMB NON-IMAGING: Scan Result: 0

## 2021-03-31 NOTE — Progress Notes (Signed)
? ?  03/31/2021 ?1:20 PM  ? ?Craig Ayala ?[birth year] ?[id number] ? ?Referring provider: Idelle Crouch, MD ?[facility] ?[place],  [place] [address] ? ?Chief Complaint  ?Patient presents with  ? Other  ? ? ?HPI: ?88 y.o. male presents for 79-month follow-up. ? ?Prior history lower urinary tract symptoms found to have a distal bulbar urethral stricture who underwent dilation October 2022 ?At last visit was scheduled for follow-up cystoscopy however he was doing well and declined to have procedure done ?Since that visit he continues to do well and states his voiding symptoms have actually improved.  He is voiding with an excellent stream.  He has occasional urgency which is not bothersome. ?Denies dysuria, gross hematuria ?No flank, abdominal or pelvic pain ? ? ?PMH: ?Past Medical History:  ?Diagnosis Date  ? Aneurysmal subarachnoid hemorrhage (HCC)   ? Cancer [facility])   ? squamous cell  ? Gout   ? Kidney stone   ? Peptic ulcer   ? Pilonidal cyst   ? Spontaneous pneumothorax   ? ? ?Surgical History: ?Past Surgical History:  ?Procedure Laterality Date  ? CHOLECYSTECTOMY N/A 12/25/2016  ? Procedure: LAPAROSCOPIC CHOLECYSTECTOMY;  Surgeon: Jules Husbands, MD;  Location: [facility] ORS;  Service: General;  Laterality: N/A;  ? UPPER GASTROINTESTINAL ENDOSCOPY    ? ? ?Home Medications:  ?Allergies as of 03/31/2021   ?No Known Allergies ?  ? ?  ?Medication List  ?  ? ?  ? Accurate as of March 31, 2021  1:20 PM. If you have any questions, ask your nurse or doctor.  ?  ?  ? ?  ? ?aspirin EC 81 MG tablet ?Take 81 mg by mouth daily. ?  ?bisoprolol-hydrochlorothiazide 5-6.25 MG tablet ?Commonly known as: ZIAC ?Take 1 tablet by mouth daily. ?  ?D3 ADULT PO ?Take 1 tablet by mouth daily. ?  ?Ferrous Sulfate 142 (45 Fe) MG Tbcr ?Take 45 mg by mouth daily. ?  ?Fish Oil 1200 MG Caps ?Take 1,200 mg by mouth 2 (two) times daily. ?  ?latanoprost 0.005 % ophthalmic solution ?Commonly known as: XALATAN ?1 drop at bedtime. ?   ?omeprazole 20 MG capsule ?Commonly known as: PRILOSEC ?Take 20 mg by mouth daily. ?  ? ?  ? ? ?Allergies: No Known Allergies ? ?Family History: ?History reviewed. No pertinent family history. ? ?Social History:  reports that he has quit smoking. He has never used smokeless tobacco. He reports that he does not drink alcohol. No history on file for drug use. ? ? ?Physical Exam: ?BP 125/72   Pulse 68   Ht 6\' 1"  (1.854 m)   Wt 220 lb (99.8 kg)   BMI 29.03 kg/m?   ?Constitutional:  Alert and oriented, No acute distress. ?HEENT: [place] AT, moist mucus membranes.  Trachea midline, no masses. ?Cardiovascular: No clubbing, cyanosis, or edema. ?Respiratory: Normal respiratory effort, no increased work of breathing. ?Psychiatric: Normal mood and affect. ? ? ?Assessment & Plan:   ? ?1.  Bulbar urethral stricture ?Doing well without evidence of recurrent stricture ?Bladder scan PVR 0 mL ?Follow-up 1 year for recheck/PVR ?Call earlier for recurrent voiding symptoms ? ? ?Abbie Sons, MD ? ?[place] ?[address] ?[place], [place] [address] ?[phone] ? ?

## 2021-09-30 IMAGING — CR DG ABDOMEN 1V
1 series · 2 of 2 positions shown · non-contrast
Comparison: CTA 12/24/2016

CLINICAL DATA: Urinary tract symptoms.

EXAM:
ABDOMEN - 1 VIEW

[Series 1: dg abd 1 view · 0.14mm/px · 2 of 2 slices shown]
[im 1/2]
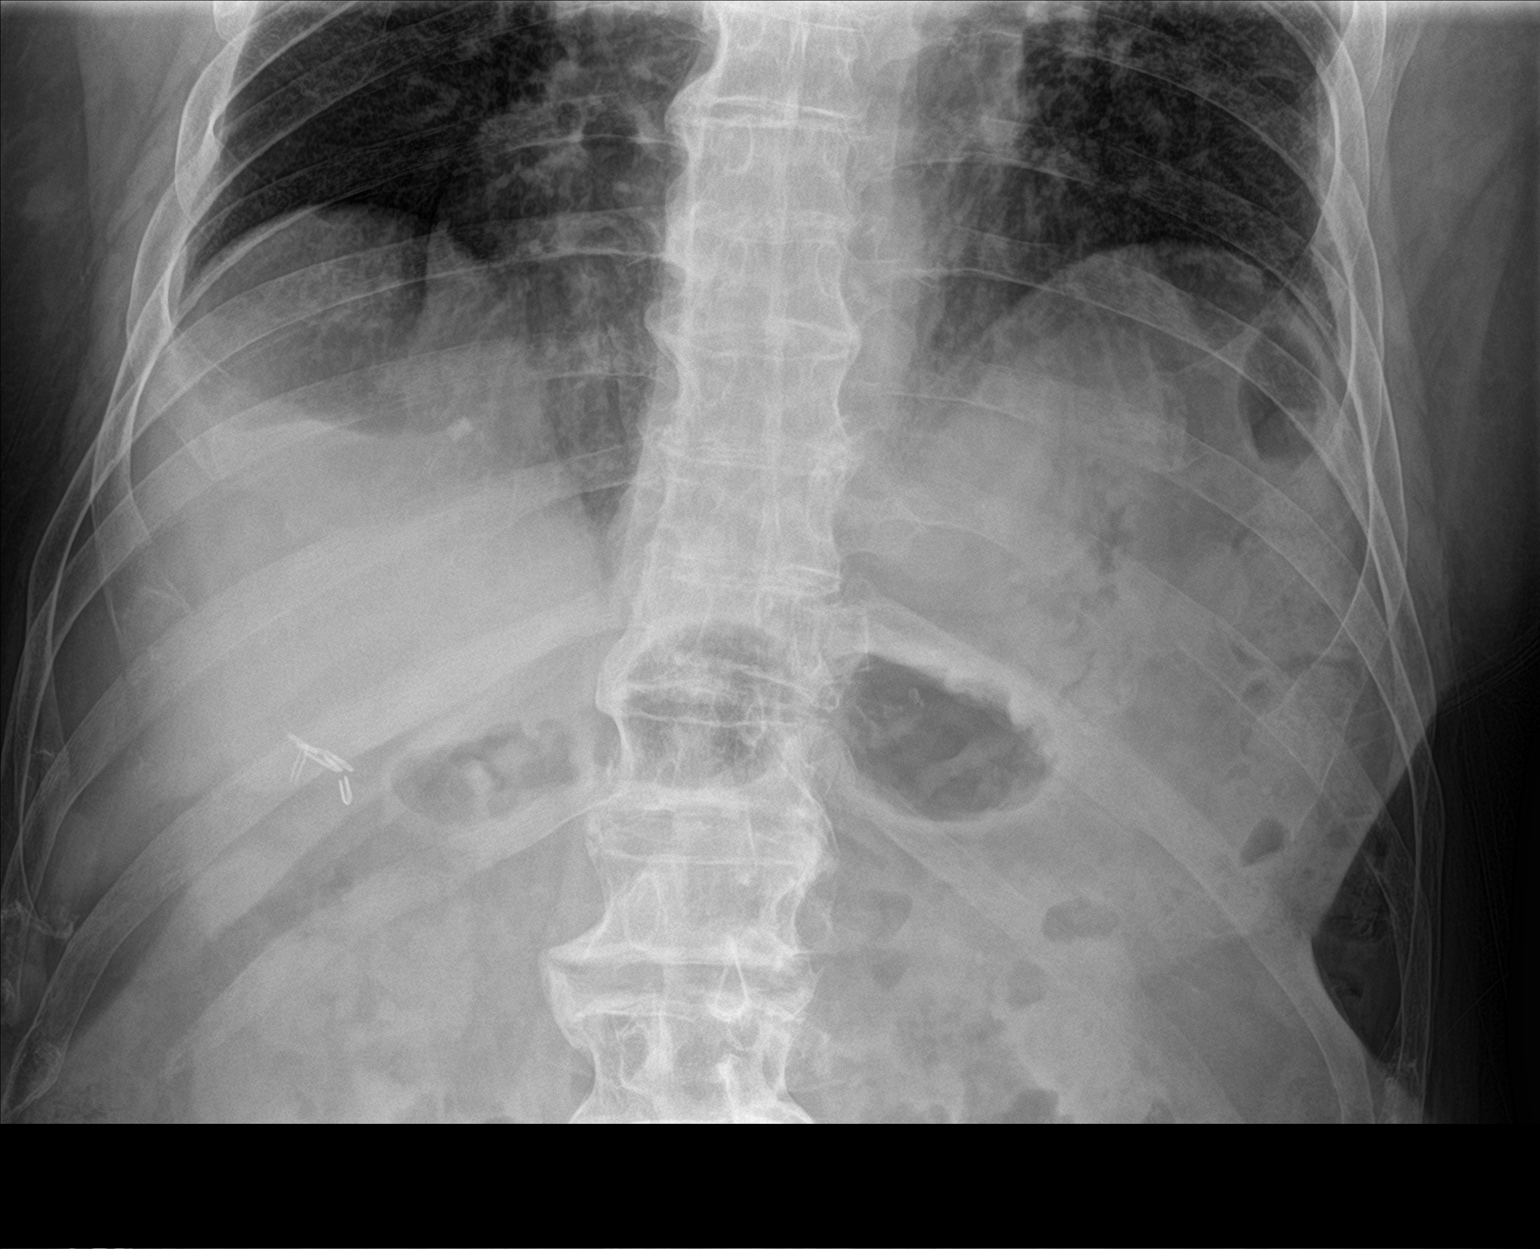
[im 2/2]
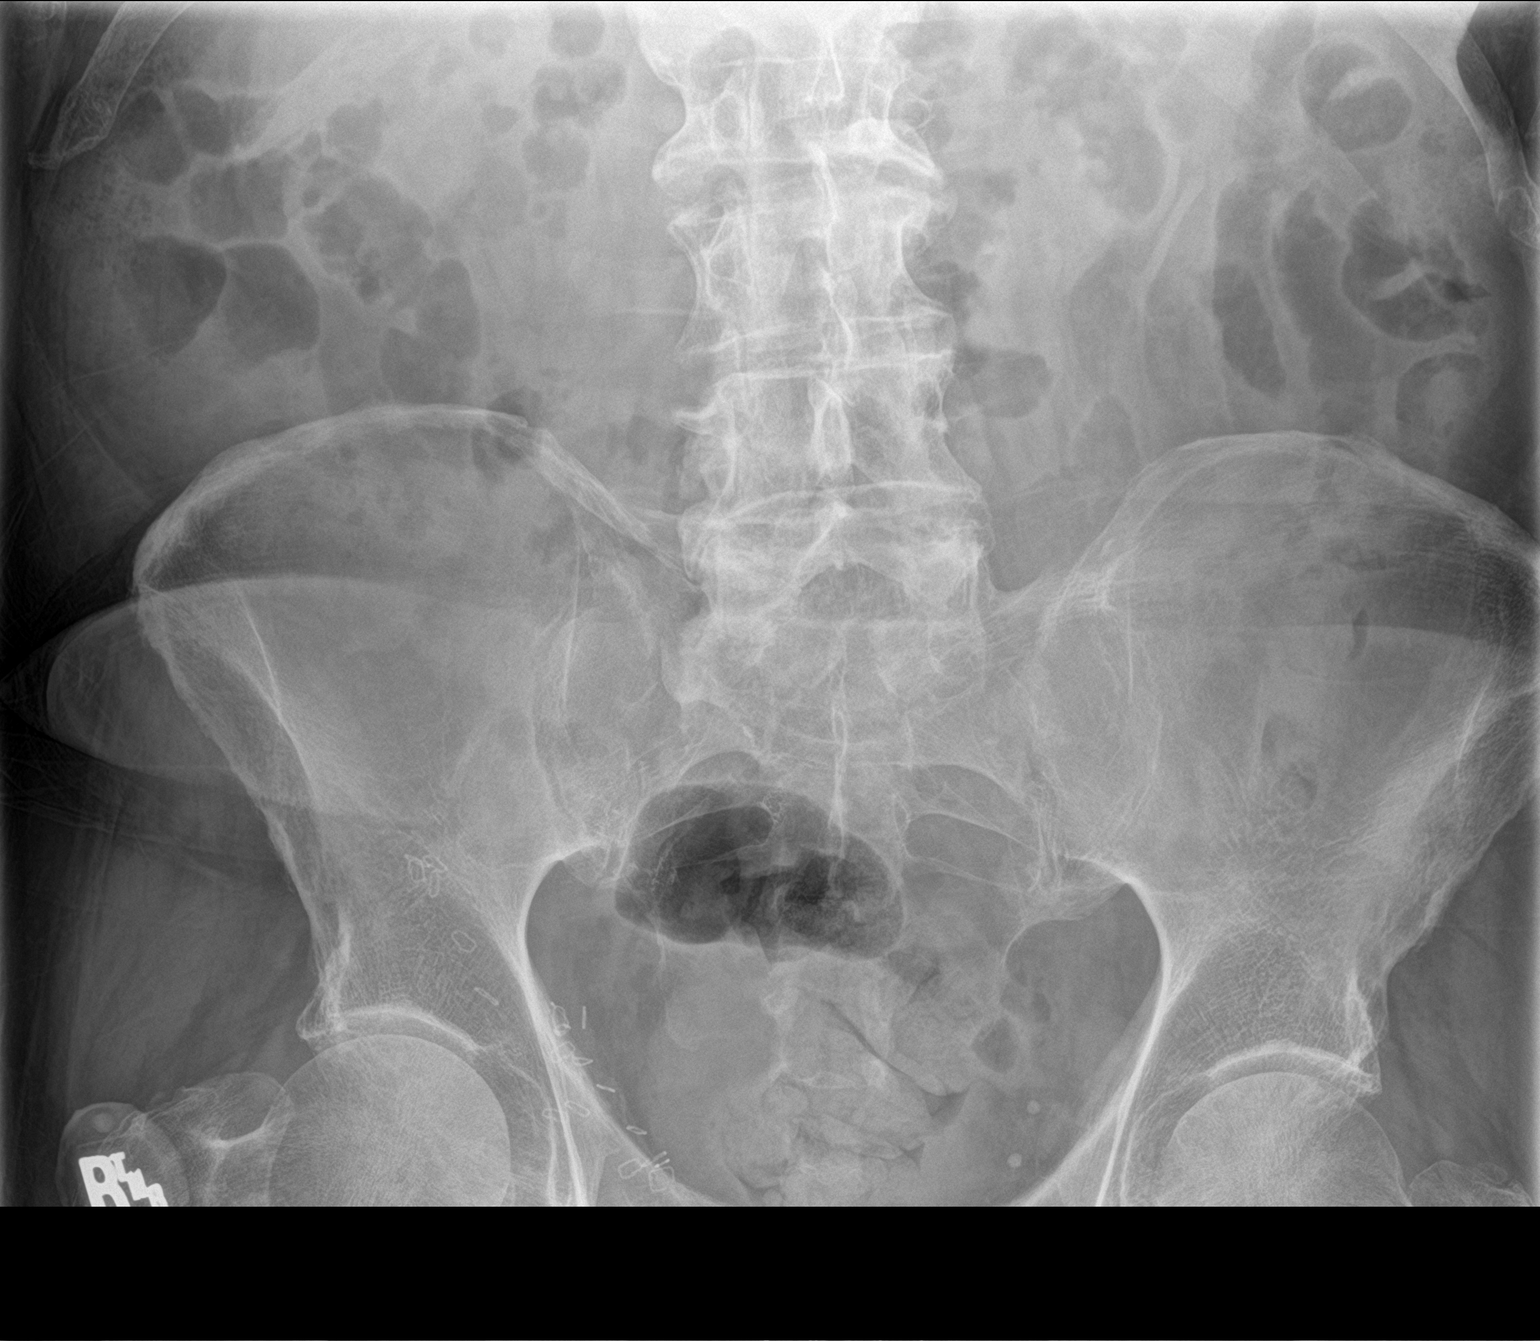

[2 of 2 positions shown; findings below may reference images not displayed]

FINDINGS: Tiny calcification in left abdomen that may represent a 2 mm left
kidney stone. No definite right renal calculi. Pelvic calcifications
are compatible with phleboliths. Patient has prostate brachytherapy
seeds. Again noted are surgical clips in the right inguinal canal
region. Nonobstructive bowel gas pattern.
IMPRESSION: Probable tiny left renal calculus.  No other definite renal calculi.

## 2021-11-24 ENCOUNTER — Inpatient Hospital Stay
Admission: EM | Admit: 2021-11-24 | Discharge: 2021-11-27 | DRG: 522 | Disposition: A | Discharge status: Home Health | Payer: Medicare Other | Attending: Student in an Organized Health Care Education/Training Program | Admitting: Student in an Organized Health Care Education/Training Program

## 2021-11-24 ENCOUNTER — Emergency Department: Payer: Medicare Other

## 2021-11-24 ENCOUNTER — Other Ambulatory Visit: Payer: Self-pay

## 2021-11-24 DIAGNOSIS — Z9049 Acquired absence of other specified parts of digestive tract: Secondary | ICD-10-CM | POA: Diagnosis not present

## 2021-11-24 DIAGNOSIS — W109XXA Fall (on) (from) unspecified stairs and steps, initial encounter: Secondary | ICD-10-CM | POA: Diagnosis present

## 2021-11-24 DIAGNOSIS — S72009A Fracture of unspecified part of neck of unspecified femur, initial encounter for closed fracture: Secondary | ICD-10-CM | POA: Diagnosis present

## 2021-11-24 DIAGNOSIS — Z79899 Other long term (current) drug therapy: Secondary | ICD-10-CM

## 2021-11-24 DIAGNOSIS — W19XXXA Unspecified fall, initial encounter: Principal | ICD-10-CM

## 2021-11-24 DIAGNOSIS — Y9301 Activity, walking, marching and hiking: Secondary | ICD-10-CM | POA: Diagnosis present

## 2021-11-24 DIAGNOSIS — S51812A Laceration without foreign body of left forearm, initial encounter: Secondary | ICD-10-CM | POA: Diagnosis present

## 2021-11-24 DIAGNOSIS — Z8546 Personal history of malignant neoplasm of prostate: Secondary | ICD-10-CM | POA: Diagnosis not present

## 2021-11-24 DIAGNOSIS — Z23 Encounter for immunization: Secondary | ICD-10-CM | POA: Diagnosis present

## 2021-11-24 DIAGNOSIS — S72002A Fracture of unspecified part of neck of left femur, initial encounter for closed fracture: Principal | ICD-10-CM | POA: Diagnosis present

## 2021-11-24 DIAGNOSIS — Z87442 Personal history of urinary calculi: Secondary | ICD-10-CM | POA: Diagnosis not present

## 2021-11-24 DIAGNOSIS — Z87891 Personal history of nicotine dependence: Secondary | ICD-10-CM

## 2021-11-24 DIAGNOSIS — Z7982 Long term (current) use of aspirin: Secondary | ICD-10-CM | POA: Diagnosis not present

## 2021-11-24 DIAGNOSIS — Y92018 Other place in single-family (private) house as the place of occurrence of the external cause: Secondary | ICD-10-CM | POA: Diagnosis not present

## 2021-11-24 DIAGNOSIS — M109 Gout, unspecified: Secondary | ICD-10-CM | POA: Diagnosis present

## 2021-11-24 DIAGNOSIS — I10 Essential (primary) hypertension: Secondary | ICD-10-CM | POA: Diagnosis present

## 2021-11-24 DIAGNOSIS — Z8711 Personal history of peptic ulcer disease: Secondary | ICD-10-CM | POA: Diagnosis not present

## 2021-11-24 LAB — CBC WITH DIFFERENTIAL/PLATELET
Abs Immature Granulocytes: 0.02 10*3/uL (ref 0.00–0.07)
Basophils Absolute: 0.1 10*3/uL (ref 0.0–0.1)
Basophils Relative: 1 %
Eosinophils Absolute: 0.3 10*3/uL (ref 0.0–0.5)
Eosinophils Relative: 5 %
HCT: 40.6 % (ref 39.0–52.0)
Hemoglobin: 13.9 g/dL (ref 13.0–17.0)
Immature Granulocytes: 0 %
Lymphocytes Relative: 27 %
Lymphs Abs: 1.5 10*3/uL (ref 0.7–4.0)
MCH: 32.3 pg (ref 26.0–34.0)
MCHC: 34.2 g/dL (ref 30.0–36.0)
MCV: 94.2 fL (ref 80.0–100.0)
Monocytes Absolute: 0.5 10*3/uL (ref 0.1–1.0)
Monocytes Relative: 8 %
Neutro Abs: 3.3 10*3/uL (ref 1.7–7.7)
Neutrophils Relative %: 59 %
Platelets: 153 10*3/uL (ref 150–400)
RBC: 4.31 MIL/uL (ref 4.22–5.81)
RDW: 13.4 % (ref 11.5–15.5)
WBC: 5.5 10*3/uL (ref 4.0–10.5)
nRBC: 0 % (ref 0.0–0.2)

## 2021-11-24 LAB — COMPREHENSIVE METABOLIC PANEL
ALT: 30 U/L (ref 0–44)
AST: 28 U/L (ref 15–41)
Albumin: 4 g/dL (ref 3.5–5.0)
Alkaline Phosphatase: 61 U/L (ref 38–126)
Anion gap: 7 (ref 5–15)
BUN: 20 mg/dL (ref 8–23)
CO2: 27 mmol/L (ref 22–32)
Calcium: 8.9 mg/dL (ref 8.9–10.3)
Chloride: 107 mmol/L (ref 98–111)
Creatinine, Ser: 1.21 mg/dL (ref 0.61–1.24)
GFR, Estimated: 57 mL/min — ABNORMAL LOW (ref >60)
Glucose, Bld: 142 mg/dL — ABNORMAL HIGH (ref 70–99)
Potassium: 4.5 mmol/L (ref 3.5–5.1)
Sodium: 141 mmol/L (ref 135–145)
Total Bilirubin: 1 mg/dL (ref 0.3–1.2)
Total Protein: 6.9 g/dL (ref 6.5–8.1)

## 2021-11-24 LAB — PROTIME-INR
INR: 1.1 (ref 0.8–1.2)
Prothrombin Time: 13.9 seconds (ref 11.4–15.2)

## 2021-11-24 MED ORDER — POLYETHYLENE GLYCOL 3350 17 G PO PACK: 17.0000 g | PACK | Freq: Every day | ORAL | Status: DC | PRN

## 2021-11-24 MED ORDER — TIZANIDINE HCL 4 MG PO TABS
2.0000 mg | ORAL_TABLET | Freq: Every evening | ORAL | Status: DC | PRN
  Administered 2021-11-24: 2 mg via ORAL
  Filled 2021-11-24: qty 1

## 2021-11-24 MED ORDER — HYDROCODONE-ACETAMINOPHEN 5-325 MG PO TABS: 1.0000 | ORAL_TABLET | Freq: Four times a day (QID) | ORAL | Status: DC | PRN

## 2021-11-24 MED ORDER — LATANOPROST 0.005 % OP SOLN
1.0000 [drp] | Freq: Every day | OPHTHALMIC | Status: DC
  Administered 2021-11-24 – 2021-11-26 (×3): 1 [drp] via OPHTHALMIC
  Filled 2021-11-24: qty 2.5

## 2021-11-24 MED ORDER — SODIUM CHLORIDE 0.9 % IV BOLUS
1000.0000 mL | Freq: Once | INTRAVENOUS | Status: AC
  Administered 2021-11-24: 1000 mL via INTRAVENOUS

## 2021-11-24 MED ORDER — BISOPROLOL-HYDROCHLOROTHIAZIDE 5-6.25 MG PO TABS
1.0000 | ORAL_TABLET | Freq: Every day | ORAL | Status: DC
  Administered 2021-11-26 – 2021-11-27 (×2): 1 via ORAL
  Filled 2021-11-24 (×3): qty 1

## 2021-11-24 MED ORDER — MUPIROCIN 2 % EX OINT
1.0000 | TOPICAL_OINTMENT | Freq: Two times a day (BID) | CUTANEOUS | Status: DC
  Administered 2021-11-25 (×2): 1 via NASAL
  Filled 2021-11-24: qty 22

## 2021-11-24 MED ORDER — FENTANYL CITRATE PF 50 MCG/ML IJ SOSY
50.0000 ug | PREFILLED_SYRINGE | Freq: Once | INTRAMUSCULAR | Status: AC
  Administered 2021-11-24: 50 ug via INTRAVENOUS
  Filled 2021-11-24: qty 1

## 2021-11-24 MED ORDER — DOXAZOSIN MESYLATE 2 MG PO TABS
2.0000 mg | ORAL_TABLET | Freq: Every evening | ORAL | Status: DC
  Administered 2021-11-24 – 2021-11-26 (×3): 2 mg via ORAL
  Filled 2021-11-24 (×4): qty 1

## 2021-11-24 MED ORDER — PANTOPRAZOLE SODIUM 40 MG PO TBEC
40.0000 mg | DELAYED_RELEASE_TABLET | Freq: Every day | ORAL | Status: DC
  Administered 2021-11-24 – 2021-11-27 (×3): 40 mg via ORAL
  Filled 2021-11-24 (×3): qty 1

## 2021-11-24 MED ORDER — HEPARIN SODIUM (PORCINE) 5000 UNIT/ML IJ SOLN
5000.0000 [IU] | Freq: Three times a day (TID) | INTRAMUSCULAR | Status: AC
  Administered 2021-11-24: 5000 [IU] via SUBCUTANEOUS
  Filled 2021-11-24: qty 1

## 2021-11-24 MED ORDER — ACETAMINOPHEN 500 MG PO TABS
1000.0000 mg | ORAL_TABLET | Freq: Three times a day (TID) | ORAL | Status: DC
  Administered 2021-11-24 – 2021-11-27 (×6): 1000 mg via ORAL
  Filled 2021-11-24 (×7): qty 2

## 2021-11-24 MED ORDER — HYDROMORPHONE HCL 1 MG/ML IJ SOLN
0.5000 mg | INTRAMUSCULAR | Status: DC | PRN
  Administered 2021-11-24: 0.5 mg via INTRAVENOUS
  Filled 2021-11-24: qty 1

## 2021-11-24 MED ORDER — TETANUS-DIPHTH-ACELL PERTUSSIS 5-2.5-18.5 LF-MCG/0.5 IM SUSY
0.5000 mL | PREFILLED_SYRINGE | Freq: Once | INTRAMUSCULAR | Status: AC
  Administered 2021-11-24: 0.5 mL via INTRAMUSCULAR
  Filled 2021-11-24: qty 0.5

## 2021-11-24 NOTE — Assessment & Plan Note (Signed)
-   Continue home bisoprolol-HCTZ

## 2021-11-24 NOTE — ED Provider Notes (Signed)
[facility] Provider Note    Event Date/Time   First MD Initiated Contact with Patient 11/24/21 1236     (approximate)   History   Fall   HPI  Craig Ayala is a 88 y.o. male who presents to the emergency department following a fall.  Patient states that he was walking down the stairs because he forgot something and thought he was on the last step, tripped and fell hitting his head and his left hip.  Denies loss of consciousness.  Currently complaining of pain to his left hip.  Unable to ambulate since the fall.  Received IV fentanyl with EMS.  Did have some improvement of the pain.  Denies any numbness.  Denies any pain to his chest, neck or back.  Denies any nausea or vomiting.  Not on anticoagulation.  Last ate at 930 this morning.      Physical Exam   Triage Vital Signs: ED Triage Vitals  Enc Vitals Group     BP 11/24/21 1246 (!) 153/78     Pulse Rate 11/24/21 1246 60     Resp 11/24/21 1246 18     Temp 11/24/21 1246 97.6 F (36.4 C)     Temp Source 11/24/21 1246 Oral     SpO2 11/24/21 1246 96 %     Weight 11/24/21 1237 220 lb (99.8 kg)     Height 11/24/21 1237 6' (1.829 m)     Head Circumference --      Peak Flow --      Pain Score 11/24/21 1246 (S) 9     Pain Loc --      Pain Edu? --      Excl. in [place]? --     Most recent vital signs: Vitals:   11/24/21 1330 11/24/21 1456  BP: (!) 161/97 (!) 168/71  Pulse: 61 63  Resp: (!) 21 18  Temp:    SpO2: 96% 92%    Physical Exam Constitutional:      Appearance: He is well-developed.  HENT:     Head: Atraumatic.     Right Ear: External ear normal.     Left Ear: External ear normal.  Eyes:     Extraocular Movements: Extraocular movements intact.     Conjunctiva/sclera: Conjunctivae normal.     Pupils: Pupils are equal, round, and reactive to light.  Neck:     Comments: No midline cervical spine tenderness to palpation. Cardiovascular:     Rate and Rhythm: Normal rate and regular  rhythm.  Pulmonary:     Effort: Pulmonary effort is normal. No respiratory distress.  Abdominal:     General: There is no distension.  Musculoskeletal:     Cervical back: Normal range of motion.     Comments: Left hip is shortened and rotated.  Tenderness to the left hip.  +2 DP pulses that are equal bilaterally.  Good capillary refill.  Sensation intact.  No tenderness to bilateral knees or ankles.  No tenderness to bilateral upper extremities.  Skin:    General: Skin is warm.     Comments: Skin tear to the left forearm  Neurological:     Mental Status: He is alert. Mental status is at baseline.          IMPRESSION / MDM / ASSESSMENT AND PLAN / ED COURSE  I reviewed the triage vital signs and the nursing notes.  88 year old male presents to the emergency department following a nonsyncopal fall with left hip  pain and head injury.  On arrival afebrile, hemodynamically stable.  Not on anticoagulation.  Differential diagnosis concerning for intracranial hemorrhage, hip fracture, pelvic fracture, hip strain  Patient was given IV fentanyl for pain control.  Plan for CT scan of the head and x-ray imaging.  Will obtain lab work.  No prodromal symptoms have a low suspicion for infectious process or dehydration.  Given 1 L fluid      EKG  No tachycardic or bradycardic dysrhythmias while on cardiac telemetry  RADIOLOGY I independently reviewed imaging, my interpretation of imaging: X-ray imaging of the left hip showed acute hip fracture.  CT scan of the head showed no signs of intracranial hemorrhage or infarction.      ED Results / Procedures / Treatments   Labs (all labs ordered are listed, but only abnormal results are displayed) Labs interpreted as -   No significant lab work abnormalities.  No significant anemia.  No significant electrolyte abnormalities.  Labs Reviewed  COMPREHENSIVE METABOLIC PANEL - Abnormal; Notable for the following components:      Result Value    Glucose, Bld 142 (*)    GFR, Estimated 57 (*)    All other components within normal limits  CBC WITH DIFFERENTIAL/PLATELET  PROTIME-INR    Consulted orthopedic surgery and discussed the patient's case with Dr. Karel Jarvis.  Recommended an x-ray of the left femur.  Recommended admission to the hospitalist with plans of operation of a partial hip tomorrow.  N.p.o. at midnight.  1 dose of heparin.  Consulted the hospitalist, patient admitted to the hospitalist for acute hip fracture.   PROCEDURES:  Critical Care performed: No  Procedures  Patient's presentation is most consistent with acute presentation with potential threat to life or bodily function.   MEDICATIONS ORDERED IN ED: Medications  fentaNYL (SUBLIMAZE) injection 50 mcg (50 mcg Intravenous Given 11/24/21 1332)  sodium chloride 0.9 % bolus 1,000 mL (0 mLs Intravenous Stopped 11/24/21 1458)  Tdap (BOOSTRIX) injection 0.5 mL (0.5 mLs Intramuscular Given 11/24/21 1427)    FINAL CLINICAL IMPRESSION(S) / ED DIAGNOSES   Final diagnoses:  Fall, initial encounter  Closed fracture of left hip, initial encounter ([place])     Rx / DC Orders   ED Discharge Orders     None        Note:  This document was prepared using Dragon voice recognition software and may include unintentional dictation errors.   Nathaniel Man, MD 11/24/21 [phone]

## 2021-11-24 NOTE — ED Notes (Signed)
ED TO INPATIENT HANDOFF REPORT  ED Nurse Name and Phone #: Baxter Flattery, RN  S Name/Age/Gender Craig Ayala 88 y.o. male Room/Bed: ED09A/ED09A  Code Status   Code Status: Prior  Home/SNF/Other Home Patient oriented to: self, place, time, and situation Is this baseline? Yes   Triage Complete: Triage complete  Chief Complaint Hip fracture [facility]) [S72.009A]  Triage Note [facility] reports pt coming from home for a fall. Pt missed a step and fell on left side hitting his head. No LOC, no blood thinners. C/o left hip pain. Shortening and rotation per EMS. Fentanyl given by EMS.   Allergies No Known Allergies  Level of Care/Admitting Diagnosis ED Disposition     ED Disposition  Admit   Condition  --   [place]: [place] [[id number]]  Level of Care: Med-Surg [16]  Covid Evaluation: Asymptomatic - no recent exposure (last 10 days) testing not required  Diagnosis: Hip fracture [facility]) [[id number]]  Admitting Physician: Jose Persia [id number]  Attending Physician: Jose Persia [id number]  Certification:: I certify this patient will need inpatient services for at least 2 midnights  Estimated Length of Stay: 3          B Medical/Surgery History Past Medical History:  Diagnosis Date   Aneurysmal subarachnoid hemorrhage ([place])    Cancer ([place])    squamous cell   Gout    Kidney stone    Peptic ulcer    Pilonidal cyst    Spontaneous pneumothorax    Past Surgical History:  Procedure Laterality Date   CHOLECYSTECTOMY N/A 12/25/2016   Procedure: LAPAROSCOPIC CHOLECYSTECTOMY;  Surgeon: Jules Husbands, MD;  Location: [facility] ORS;  Service: General;  Laterality: N/A;   UPPER GASTROINTESTINAL ENDOSCOPY       A IV Location/Drains/Wounds Patient Lines/Drains/Airways Status     Active Line/Drains/Airways     Name Placement date Placement time Site Days   Peripheral IV 11/24/21 18 G 1" Posterior;Right Hand 11/24/21  1237  Hand  less than 1   Incision  - 4 Ports Abdomen Upper;Mid Right;Upper Lower;Right Umbilicus 16/01/09  3235  -- 1795            Intake/Output Last 24 hours No intake or output data in the 24 hours ending 11/24/21 1549  Labs/Imaging Results for orders placed or performed during the hospital encounter of 11/24/21 (from the past 48 hour(s))  CBC with Differential     Status: None   Collection Time: 11/24/21 12:47 PM  Result Value Ref Range   WBC 5.5 4.0 - 10.5 K/uL   RBC 4.31 4.22 - 5.81 MIL/uL   Hemoglobin 13.9 13.0 - 17.0 g/dL   HCT 40.6 39.0 - 52.0 %   MCV 94.2 80.0 - 100.0 fL   MCH 32.3 26.0 - 34.0 pg   MCHC 34.2 30.0 - 36.0 g/dL   RDW 13.4 11.5 - 15.5 %   Platelets 153 150 - 400 K/uL   nRBC 0.0 0.0 - 0.2 %   Neutrophils Relative % 59 %   Neutro Abs 3.3 1.7 - 7.7 K/uL   Lymphocytes Relative 27 %   Lymphs Abs 1.5 0.7 - 4.0 K/uL   Monocytes Relative 8 %   Monocytes Absolute 0.5 0.1 - 1.0 K/uL   Eosinophils Relative 5 %   Eosinophils Absolute 0.3 0.0 - 0.5 K/uL   Basophils Relative 1 %   Basophils Absolute 0.1 0.0 - 0.1 K/uL   Immature Granulocytes 0 %   Abs Immature Granulocytes 0.02 0.00 -  0.07 K/uL    Comment: Performed at [facility], [address] [place], [place] [address]  Protime-INR     Status: None   Collection Time: 11/24/21 12:47 PM  Result Value Ref Range   Prothrombin Time 13.9 11.4 - 15.2 seconds   INR 1.1 0.8 - 1.2    Comment: (NOTE) INR goal varies based on device and disease states. Performed at [facility], [address] [place],  [address]   Comprehensive metabolic panel     Status: Abnormal   Collection Time: 11/24/21 12:47 PM  Result Value Ref Range   Sodium 141 135 - 145 mmol/L   Potassium 4.5 3.5 - 5.1 mmol/L   Chloride 107 98 - 111 mmol/L   CO2 27 22 - 32 mmol/L   Glucose, Bld 142 (H) 70 - 99 mg/dL    Comment: Glucose reference range applies only to samples taken after fasting for at least 8 hours.   BUN 20 8 - 23 mg/dL   Creatinine,  Ser 1.21 0.61 - 1.24 mg/dL   Calcium 8.9 8.9 - 10.3 mg/dL   Total Protein 6.9 6.5 - 8.1 g/dL   Albumin 4.0 3.5 - 5.0 g/dL   AST 28 15 - 41 U/L   ALT 30 0 - 44 U/L   Alkaline Phosphatase 61 38 - 126 U/L   Total Bilirubin 1.0 0.3 - 1.2 mg/dL   GFR, Estimated 57 (L) >60 mL/min    Comment: (NOTE) Calculated using the CKD-EPI Creatinine Equation (2021)    Anion gap 7 5 - 15    Comment: Performed at [facility], [address], [place],  [address]   DG Chest 1 View  Result Date: 11/24/2021 CLINICAL DATA:  Fall at home today.  Left hip fracture. EXAM: CHEST  1 VIEW COMPARISON:  Radiographs 12/24/2016. FINDINGS: 1404 hours. Mild patient rotation to the right. Allowing for this, the heart size and mediastinal contours are stable. There is mild chronic scarring at the left lung base. No confluent airspace opacity, pleural effusion or pneumothorax identified. Old left-sided rib fractures are noted with adjacent pleural thickening. No acute fractures are seen within the visualized chest. Telemetry leads overlie the chest. IMPRESSION: No evidence of acute chest injury. Old left-sided rib fractures and left basilar scarring. Electronically Signed   By: Richardean Sale M.D.   On: 11/24/2021 14:31   DG Hip Unilat With Pelvis 2-3 Views Left  Result Date: 11/24/2021 CLINICAL DATA:  Left hip pain after falling at home today. EXAM: DG HIP (WITH OR WITHOUT PELVIS) 2-3V LEFT COMPARISON:  Abdominal radiograph 07/31/2020.  Pelvic CT 12/24/2016. FINDINGS: The bones are demineralized. There is a superiorly displaced fracture through the mid left femoral neck which appears acute. No other evidence of acute fracture or dislocation. No osseous metastases are identified. There are mild degenerative changes of both hips and sacroiliac joints. The sacroiliac joints appear partially ankylosed on prior CT. Asymmetric ossification of the right ligament. Prostate brachytherapy seeds and postsurgical clips in  the right inguinal region are noted. IMPRESSION: Acute, superiorly displaced fracture of the left femoral neck. Electronically Signed   By: Richardean Sale M.D.   On: 11/24/2021 14:30   CT Head Wo Contrast  Result Date: 11/24/2021 CLINICAL DATA:  Provided history: Head trauma, minor. EXAM: CT HEAD WITHOUT CONTRAST TECHNIQUE: Contiguous axial images were obtained from the base of the skull through the vertex without intravenous contrast. RADIATION DOSE REDUCTION: This exam was performed according to the departmental  dose-optimization program which includes automated exposure control, adjustment of the mA and/or kV according to patient size and/or use of iterative reconstruction technique. COMPARISON:  No pertinent prior exams available for comparison. FINDINGS: Brain: Generalized cerebral atrophy. Chronic lacunar infarcts within bilateral basal ganglia. Mild patchy and ill-defined hypoattenuation within the cerebral white matter, nonspecific but compatible with chronic small vessel ischemic disease. There is no acute intracranial hemorrhage. No demarcated cortical infarct. No extra-axial fluid collection. No evidence of an intracranial mass. No midline shift. Vascular: No hyperdense vessel. Atherosclerotic calcifications. Skull: No fracture or aggressive osseous lesion. Sinuses/Orbits: No mass or acute finding within the imaged orbits. No significant paranasal sinus disease at the imaged levels. IMPRESSION: No evidence of acute intracranial abnormality. Parenchymal atrophy and chronic small vessel ischemic disease with chronic lacunar infarcts, as described. Electronically Signed   By: Kellie Simmering D.O.   On: 11/24/2021 14:14    Pending Labs Unresulted Labs (From admission, onward)    None       Vitals/Pain Today's Vitals   11/24/21 1246 11/24/21 1300 11/24/21 1330 11/24/21 1456  BP: (!) 153/78 (!) 156/75 (!) 161/97 (!) 168/71  Pulse: 60 62 61 63  Resp: 18 17 (!) 21 18  Temp: 97.6 F (36.4 C)      TempSrc: Oral     SpO2: 96% 95% 96% 92%  Weight:      Height:      PainSc: (S) 9        Isolation Precautions No active isolations  Medications Medications  fentaNYL (SUBLIMAZE) injection 50 mcg (50 mcg Intravenous Given 11/24/21 1332)  sodium chloride 0.9 % bolus 1,000 mL (0 mLs Intravenous Stopped 11/24/21 1458)  Tdap (BOOSTRIX) injection 0.5 mL (0.5 mLs Intramuscular Given 11/24/21 1427)    Mobility walks Low fall risk   Focused Assessments Cardiac Assessment Handoff:    Lab Results  Component Value Date   TROPONINI <0.03 12/24/2016   No results found for: "DDIMER" Does the Patient currently have chest pain? No    R Recommendations: See Admitting Provider Note  Report given to:   Additional Notes:

## 2021-11-24 NOTE — H&P (Signed)
History and Physical    Patient: Craig Ayala MWU:[id number] DOB: 07/31/1929 DOA: 11/24/2021 DOS: the patient was seen and examined on 11/24/2021 PCP: Idelle Crouch, MD  Patient coming from: Home  Chief Complaint:  Chief Complaint  Patient presents with   Fall   HPI: Craig Ayala is a 88 y.o. male with medical history significant of hypertension, aneurysmal SAH (1973), spontaneous pneumothorax (> 50 years ago), prostate cancer who presents to the ED after a fall.  Craig Ayala states Craig Ayala was in his normal state of health when Craig Ayala was walking down the steps this morning.  Craig Ayala believed Craig Ayala was on the last step but was actually on the second to last step.  Craig Ayala made a sudden turn to turn around and stepped down, at which point Craig Ayala slipped.  Craig Ayala fell onto his left hip and hit his head on his front door.  Craig Ayala denies any loss of consciousness, dizziness, vision changes, headache, focal weakness.  After his fall, Craig Ayala experienced severe pain in the left hip with inability to bear weight.  Craig Ayala denies any other complaints at this time including fever, chills, shortness of breath, palpitations, chest pain, nausea, vomiting, diarrhea.  ED course: On arrival to the ED, patient was hypertensive at 156/75 with heart rate of 62.  CT head was obtained that was negative for any acute intracranial abnormalities.  Femur x-ray obtained that demonstrates a vertically oriented fracture of the left femoral neck.  Orthopedic surgery was contacted with plans for surgical repair on 10/20 6 AM.  [facility] contacted for admission.  Review of Systems: As mentioned in the history of present illness. All other systems reviewed and are negative. Past Medical History:  Diagnosis Date   Aneurysmal subarachnoid hemorrhage ([facility])    Cancer ([facility])    squamous cell   Gout    Kidney stone    Peptic ulcer    Pilonidal cyst    Spontaneous pneumothorax    Past Surgical History:  Procedure Laterality Date   CHOLECYSTECTOMY N/A 12/25/2016    Procedure: LAPAROSCOPIC CHOLECYSTECTOMY;  Surgeon: Jules Husbands, MD;  Location: [facility] ORS;  Service: General;  Laterality: N/A;   UPPER GASTROINTESTINAL ENDOSCOPY     Social History:  reports that Craig Ayala has quit smoking. Craig Ayala has never used smokeless tobacco. Craig Ayala reports that Craig Ayala does not drink alcohol. No history on file for drug use.  No Known Allergies  History reviewed. No pertinent family history.  Prior to Admission medications   Medication Sig Start Date End Date Taking? Authorizing Provider  aspirin EC 81 MG tablet Take 81 mg by mouth daily.   Yes [provider]  bisoprolol-hydrochlorothiazide (ZIAC) 5-6.25 MG tablet Take 1 tablet by mouth daily. 10/26/20  Yes [provider]  Cholecalciferol (D3 ADULT PO) Take 1 tablet by mouth daily.   Yes [provider]  doxazosin (CARDURA) 2 MG tablet Take 2 mg by mouth every evening. 10/29/21  Yes [provider]  Ferrous Sulfate 142 (45 Fe) MG TBCR Take 45 mg by mouth daily.   Yes [provider]  latanoprost (XALATAN) 0.005 % ophthalmic solution Place 1 drop into both eyes at bedtime.   Yes [provider]  Omega-3 Fatty Acids (FISH OIL) 1200 MG CAPS Take 1,200 mg by mouth 2 (two) times daily.   Yes [provider]  omeprazole (PRILOSEC) 20 MG capsule Take 20 mg by mouth daily. 10/25/16  Yes [provider]  tiZANidine (ZANAFLEX) 2 MG tablet Take 2  mg by mouth at bedtime as needed for muscle spasms. 10/19/21   [provider]    Physical Exam: Vitals:   11/24/21 1246 11/24/21 1300 11/24/21 1330 11/24/21 1456  BP: (!) 153/78 (!) 156/75 (!) 161/97 (!) 168/71  Pulse: 60 62 61 63  Resp: 18 17 (!) 21 18  Temp: 97.6 F (36.4 C)     TempSrc: Oral     SpO2: 96% 95% 96% 92%  Weight:      Height:       Physical Exam Vitals and nursing note reviewed.  Constitutional:      General: Craig Ayala is not in acute distress.    Appearance: Craig Ayala is normal weight. Craig Ayala is not  toxic-appearing.  HENT:     Head: Normocephalic and atraumatic. No raccoon eyes, Battle's sign, contusion or laceration.  Eyes:     Conjunctiva/sclera: Conjunctivae normal.     Pupils: Pupils are equal, round, and reactive to light.  Cardiovascular:     Rate and Rhythm: Normal rate and regular rhythm.     Heart sounds: No murmur heard.    No gallop.  Pulmonary:     Effort: Pulmonary effort is normal. No respiratory distress.     Breath sounds: Normal breath sounds. No wheezing or rales.  Abdominal:     General: Bowel sounds are normal.     Palpations: Abdomen is soft.  Musculoskeletal:     Right lower leg: No edema.     Left lower leg: No edema.     Comments: Left lower extremity externally rotated.  No tenderness to palpation along the left lateral thigh or anterior thigh.  Inability to move left extremity secondary to pain.  No right extremity abnormalities.  Skin:    General: Skin is warm and dry.  Neurological:     General: No focal deficit present.     Mental Status: Craig Ayala is alert and oriented to person, place, and time. Mental status is at baseline.  Psychiatric:        Mood and Affect: Mood normal.        Behavior: Behavior normal.    Data Reviewed: CBC obtained with no evidence of leukocytosis, anemia or thrombocytopenia.  CMP obtained that shows mildly elevated glucose at 142, creatinine of 1.21 and GFR 57.  PT/INR within normal limits.  DG FEMUR MIN 2 VIEWS LEFT  Result Date: 11/24/2021 CLINICAL DATA:  Hip fracture. EXAM: LEFT FEMUR 2 VIEWS COMPARISON:  Pelvis and left hip radiographs 11/24/2021 FINDINGS: Redemonstration of vertical oriented fracture of the left femoral neck with mild superior displacement of the distal fracture, with respect to the proximal fracture component, unchanged. Mild left femoroacetabular osteoarthritis. Mild-to-moderate partially visualized left knee osteoarthritis. Mild chronic enthesopathic change at the quadriceps insertion on the patella.  Brachytherapy seeds are again seen. IMPRESSION: Redemonstration of vertically oriented fracture of the left femoral neck. No additional fracture is seen within the more distal left femur. Electronically Signed   By: Yvonne Kendall M.D.   On: 11/24/2021 16:05   DG Chest 1 View  Result Date: 11/24/2021 CLINICAL DATA:  Fall at home today.  Left hip fracture. EXAM: CHEST  1 VIEW COMPARISON:  Radiographs 12/24/2016. FINDINGS: 1404 hours. Mild patient rotation to the right. Allowing for this, the heart size and mediastinal contours are stable. There is mild chronic scarring at the left lung base. No confluent airspace opacity, pleural effusion or pneumothorax identified. Old left-sided rib fractures are noted with adjacent pleural thickening. No acute fractures are  seen within the visualized chest. Telemetry leads overlie the chest. IMPRESSION: No evidence of acute chest injury. Old left-sided rib fractures and left basilar scarring. Electronically Signed   By: Richardean Sale M.D.   On: 11/24/2021 14:31   DG Hip Unilat With Pelvis 2-3 Views Left  Result Date: 11/24/2021 CLINICAL DATA:  Left hip pain after falling at home today. EXAM: DG HIP (WITH OR WITHOUT PELVIS) 2-3V LEFT COMPARISON:  Abdominal radiograph 07/31/2020.  Pelvic CT 12/24/2016. FINDINGS: The bones are demineralized. There is a superiorly displaced fracture through the mid left femoral neck which appears acute. No other evidence of acute fracture or dislocation. No osseous metastases are identified. There are mild degenerative changes of both hips and sacroiliac joints. The sacroiliac joints appear partially ankylosed on prior CT. Asymmetric ossification of the right ligament. Prostate brachytherapy seeds and postsurgical clips in the right inguinal region are noted. IMPRESSION: Acute, superiorly displaced fracture of the left femoral neck. Electronically Signed   By: Richardean Sale M.D.   On: 11/24/2021 14:30   CT Head Wo Contrast  Result  Date: 11/24/2021 CLINICAL DATA:  Provided history: Head trauma, minor. EXAM: CT HEAD WITHOUT CONTRAST TECHNIQUE: Contiguous axial images were obtained from the base of the skull through the vertex without intravenous contrast. RADIATION DOSE REDUCTION: This exam was performed according to the departmental dose-optimization program which includes automated exposure control, adjustment of the mA and/or kV according to patient size and/or use of iterative reconstruction technique. COMPARISON:  No pertinent prior exams available for comparison. FINDINGS: Brain: Generalized cerebral atrophy. Chronic lacunar infarcts within bilateral basal ganglia. Mild patchy and ill-defined hypoattenuation within the cerebral white matter, nonspecific but compatible with chronic small vessel ischemic disease. There is no acute intracranial hemorrhage. No demarcated cortical infarct. No extra-axial fluid collection. No evidence of an intracranial mass. No midline shift. Vascular: No hyperdense vessel. Atherosclerotic calcifications. Skull: No fracture or aggressive osseous lesion. Sinuses/Orbits: No mass or acute finding within the imaged orbits. No significant paranasal sinus disease at the imaged levels. IMPRESSION: No evidence of acute intracranial abnormality. Parenchymal atrophy and chronic small vessel ischemic disease with chronic lacunar infarcts, as described. Electronically Signed   By: Kellie Simmering D.O.   On: 11/24/2021 14:14    Results are pending, will review when available.  Assessment and Plan: * Hip fracture [facility]) Patient presenting with left femoral neck fracture in the setting of mechanical fall.  Orthopedic surgery planning to take patient to the OR tomorrow morning.  - Orthopedic surgery following; appreciate their recommendations - N.p.o. at midnight - Norco as needed for severe pain with Dilaudid for breakthrough pain - Hold heparin after midnight - Tizanidine as needed for muscle spasms  Essential  hypertension - Continue home bisoprolol-HCTZ   Advance Care Planning:   Code Status: Full Code   Consults: Orthopedic surgery  Family Communication: No family at bedside to update  Severity of Illness: The appropriate patient status for this patient is INPATIENT. Inpatient status is judged to be reasonable and necessary in order to provide the required intensity of service to ensure the patient's safety. The patient's presenting symptoms, physical exam findings, and initial radiographic and laboratory data in the context of their chronic comorbidities is felt to place them at high risk for further clinical deterioration. Furthermore, it is not anticipated that the patient will be medically stable for discharge from the hospital within 2 midnights of admission.   * I certify that at the point of admission it is my  clinical judgment that the patient will require inpatient hospital care spanning beyond 2 midnights from the point of admission due to high intensity of service, high risk for further deterioration and high frequency of surveillance required.*  Author: Jose Persia, MD 11/24/2021 6:02 PM  For on call review [email or web address].

## 2021-11-24 NOTE — H&P (View-Only) (Signed)
ORTHOPAEDIC CONSULTATION  REQUESTING PHYSICIAN: Jose Persia, MD  Chief Complaint:   Left hip fracture  History of Present Illness: Craig Ayala is a 88 y.o. male who presents with a left femoral neck fracture following a fall while walking down some steps at his home and tripping.  He reports he also hit his head at that time but did not lose consciousness.  He has not been able to ambulate since the fall.  He denies any pain in his hip prior to the fall and the injury . denies any numbness, tingling, chest pain, shortness of breath, nausea, or vomiting.  Past Medical History:  Diagnosis Date   Aneurysmal subarachnoid hemorrhage (HCC)    Cancer (HCC)    squamous cell   Gout    Kidney stone    Peptic ulcer    Pilonidal cyst    Spontaneous pneumothorax    Past Surgical History:  Procedure Laterality Date   CHOLECYSTECTOMY N/A 12/25/2016   Procedure: LAPAROSCOPIC CHOLECYSTECTOMY;  Surgeon: Jules Husbands, MD;  Location: [facility] ORS;  Service: General;  Laterality: N/A;   UPPER GASTROINTESTINAL ENDOSCOPY     Social History   Socioeconomic History   Marital status: Married    Spouse name: Not on file   Number of children: Not on file   Years of education: Not on file   Highest education level: Not on file  Occupational History   Not on file  Tobacco Use   Smoking status: Former   Smokeless tobacco: Never  Substance and Sexual Activity   Alcohol use: No   Drug use: Not on file   Sexual activity: Not on file  Other Topics Concern   Not on file  Social History Narrative   Not on file   Social Determinants of Health   Financial Resource Strain: Not on file  Food Insecurity: Not on file  Transportation Needs: Not on file  Physical Activity: Not on file  Stress: Not on file  Social Connections: Not on file   History reviewed. No pertinent family history. No Known Allergies Prior to Admission medications    Medication Sig Start Date End Date Taking? Authorizing Provider  aspirin EC 81 MG tablet Take 81 mg by mouth daily.   Yes [provider]  bisoprolol-hydrochlorothiazide (ZIAC) 5-6.25 MG tablet Take 1 tablet by mouth daily. 10/26/20  Yes [provider]  Cholecalciferol (D3 ADULT PO) Take 1 tablet by mouth daily.   Yes [provider]  doxazosin (CARDURA) 2 MG tablet Take 2 mg by mouth every evening. 10/29/21  Yes [provider]  Ferrous Sulfate 142 (45 Fe) MG TBCR Take 45 mg by mouth daily.   Yes [provider]  latanoprost (XALATAN) 0.005 % ophthalmic solution Place 1 drop into both eyes at bedtime.   Yes [provider]  Omega-3 Fatty Acids (FISH OIL) 1200 MG CAPS Take 1,200 mg by mouth 2 (two) times daily.   Yes [provider]  omeprazole (PRILOSEC) 20 MG capsule Take 20 mg by mouth daily. 10/25/16  Yes [provider]  tiZANidine (ZANAFLEX) 2 MG tablet Take 2 mg by mouth at bedtime as needed for muscle spasms. 10/19/21   [provider]   DG FEMUR MIN 2 VIEWS LEFT  Result Date: 11/24/2021 CLINICAL DATA:  Hip fracture. EXAM: LEFT FEMUR 2 VIEWS COMPARISON:  Pelvis and left hip radiographs 11/24/2021 FINDINGS: Redemonstration of vertical oriented fracture of the left femoral neck with mild superior displacement of the distal fracture,  with respect to the proximal fracture component, unchanged. Mild left femoroacetabular osteoarthritis. Mild-to-moderate partially visualized left knee osteoarthritis. Mild chronic enthesopathic change at the quadriceps insertion on the patella. Brachytherapy seeds are again seen. IMPRESSION: Redemonstration of vertically oriented fracture of the left femoral neck. No additional fracture is seen within the more distal left femur. Electronically Signed   By: Yvonne Kendall M.D.   On: 11/24/2021 16:05   DG Chest 1 View  Result Date: 11/24/2021 CLINICAL DATA:  Fall at home today.  Left  hip fracture. EXAM: CHEST  1 VIEW COMPARISON:  Radiographs 12/24/2016. FINDINGS: 1404 hours. Mild patient rotation to the right. Allowing for this, the heart size and mediastinal contours are stable. There is mild chronic scarring at the left lung base. No confluent airspace opacity, pleural effusion or pneumothorax identified. Old left-sided rib fractures are noted with adjacent pleural thickening. No acute fractures are seen within the visualized chest. Telemetry leads overlie the chest. IMPRESSION: No evidence of acute chest injury. Old left-sided rib fractures and left basilar scarring. Electronically Signed   By: Richardean Sale M.D.   On: 11/24/2021 14:31   DG Hip Unilat With Pelvis 2-3 Views Left  Result Date: 11/24/2021 CLINICAL DATA:  Left hip pain after falling at home today. EXAM: DG HIP (WITH OR WITHOUT PELVIS) 2-3V LEFT COMPARISON:  Abdominal radiograph 07/31/2020.  Pelvic CT 12/24/2016. FINDINGS: The bones are demineralized. There is a superiorly displaced fracture through the mid left femoral neck which appears acute. No other evidence of acute fracture or dislocation. No osseous metastases are identified. There are mild degenerative changes of both hips and sacroiliac joints. The sacroiliac joints appear partially ankylosed on prior CT. Asymmetric ossification of the right ligament. Prostate brachytherapy seeds and postsurgical clips in the right inguinal region are noted. IMPRESSION: Acute, superiorly displaced fracture of the left femoral neck. Electronically Signed   By: Richardean Sale M.D.   On: 11/24/2021 14:30   CT Head Wo Contrast  Result Date: 11/24/2021 CLINICAL DATA:  Provided history: Head trauma, minor. EXAM: CT HEAD WITHOUT CONTRAST TECHNIQUE: Contiguous axial images were obtained from the base of the skull through the vertex without intravenous contrast. RADIATION DOSE REDUCTION: This exam was performed according to the departmental dose-optimization program which includes  automated exposure control, adjustment of the mA and/or kV according to patient size and/or use of iterative reconstruction technique. COMPARISON:  No pertinent prior exams available for comparison. FINDINGS: Brain: Generalized cerebral atrophy. Chronic lacunar infarcts within bilateral basal ganglia. Mild patchy and ill-defined hypoattenuation within the cerebral white matter, nonspecific but compatible with chronic small vessel ischemic disease. There is no acute intracranial hemorrhage. No demarcated cortical infarct. No extra-axial fluid collection. No evidence of an intracranial mass. No midline shift. Vascular: No hyperdense vessel. Atherosclerotic calcifications. Skull: No fracture or aggressive osseous lesion. Sinuses/Orbits: No mass or acute finding within the imaged orbits. No significant paranasal sinus disease at the imaged levels. IMPRESSION: No evidence of acute intracranial abnormality. Parenchymal atrophy and chronic small vessel ischemic disease with chronic lacunar infarcts, as described. Electronically Signed   By: Kellie Simmering D.O.   On: 11/24/2021 14:14    Positive ROS: All other systems have been reviewed and were otherwise negative with the exception of those mentioned in the HPI and as above.  Physical Exam: General:  Alert, no acute distress Psychiatric:  Patient is competent for consent with normal mood and affect   Cardiovascular:  No pedal edema Respiratory:  No wheezing, non-labored breathing GI:  Abdomen is soft and non-tender Skin:  No lesions in the area of chief complaint Neurologic:  Sensation intact distally Lymphatic:  No axillary or cervical lymphadenopathy  Orthopedic Exam:  Left lower extremity  Leg is shortened externally rotated Tenderness to palpation over the left hip Pain with gentle logroll Able to dorsiflex and plantarflex the left foot, able to dorsiflex the left great toe Sensation intact over the foot and lower extremity Good DP pulse and PT pulse  left foot   X-rays:  AP and lateral x-rays of the left hip and femur reviewed which show a displaced femoral neck fracture, no other fractures noted in the distal femur no other implants or hardware noted in the femur. There is some mild superior joint space narrowing and sclerosis noted to the acetabulum.  Assessment: Left femoral neck fracture  Plan: Craig Ayala is a 88 year old male who presents with left neck fracture. I have recommended a left hip hemiarthroplasty for this patient. A long discussion took place with the patient describing what a arthroplasty is and what the procedure would entail. The xrays were reviewed with the patient and the implants were discussed. The ability to secure the implant utilizing cement or cementless (press fit) fixation was discussed. Surgical exposures were discussed with the patient as well as specific risks of using cement during surgery.   The hospitalization and post-operative care and rehabilitation were also discussed. The use of perioperative antibiotics and DVT prophylaxis were discussed. The risk, benefits and alternatives to a surgical intervention were discussed at length with the patient. The patient was also advised of risks related to the medical comorbidities and elevated body mass index (BMI). A lengthy discussion took place to review the most common complications including but not limited to: deep vein thrombosis, pulmonary embolus, heart attack, stroke, infection, wound breakdown, dislocation, numbness, leg length in-equality, damage to nerves, tendon,muscles, arteries or other blood vessels, death and other possible complications from anesthesia. The patient was told that we will take steps to minimize these risks by using sterile technique, antibiotics and DVT prophylaxis when appropriate and follow the patient postoperatively in the office setting to monitor progress. The possibility of recurrent pain, no improvement in pain and actual worsening of  pain were also discussed with the patient. The risk of dislocation following surgery was discussed and potential precautions to prevent dislocation were reviewed.    The benefits of surgery were discussed with the patient including the potential for improving the patient's current clinical condition through operative intervention. Alternatives to surgical intervention including conservative management and bedrest were also discussed in detail. All questions were answered to the satisfaction of the patient. The patient participated and agreed to the plan of care as well as the use of the recommended implants for their hip surgery.    Plan to go to the operating room tomorrow 11/25/2021 for left hip hemiarthroplasty posterior approach possible cemented component  N.p.o. after midnight for surgery tomorrow  1 dose of heparin today hold any further anticoagulation after midnight  Medical admission and clearance for surgery  Consent for surgery is signed and on the chart    Steffanie Rainwater MD  Beeper #:  [phone]  11/24/2021 5:35 PM

## 2021-11-24 NOTE — Consult Note (Addendum)
ORTHOPAEDIC CONSULTATION  REQUESTING PHYSICIAN: Jose Persia, MD  Chief Complaint:   Left hip fracture  History of Present Illness: VERA WISHART is a 88 y.o. male who presents with a left femoral neck fracture following a fall while walking down some steps at his home and tripping.  He reports he also hit his head at that time but did not lose consciousness.  He has not been able to ambulate since the fall.  He denies any pain in his hip prior to the fall and the injury . denies any numbness, tingling, chest pain, shortness of breath, nausea, or vomiting.  Past Medical History:  Diagnosis Date   Aneurysmal subarachnoid hemorrhage (HCC)    Cancer (HCC)    squamous cell   Gout    Kidney stone    Peptic ulcer    Pilonidal cyst    Spontaneous pneumothorax    Past Surgical History:  Procedure Laterality Date   CHOLECYSTECTOMY N/A 12/25/2016   Procedure: LAPAROSCOPIC CHOLECYSTECTOMY;  Surgeon: Jules Husbands, MD;  Location: [facility] ORS;  Service: General;  Laterality: N/A;   UPPER GASTROINTESTINAL ENDOSCOPY     Social History   Socioeconomic History   Marital status: Married    Spouse name: Not on file   Number of children: Not on file   Years of education: Not on file   Highest education level: Not on file  Occupational History   Not on file  Tobacco Use   Smoking status: Former   Smokeless tobacco: Never  Substance and Sexual Activity   Alcohol use: No   Drug use: Not on file   Sexual activity: Not on file  Other Topics Concern   Not on file  Social History Narrative   Not on file   Social Determinants of Health   Financial Resource Strain: Not on file  Food Insecurity: Not on file  Transportation Needs: Not on file  Physical Activity: Not on file  Stress: Not on file  Social Connections: Not on file   History reviewed. No pertinent family history. No Known Allergies Prior to Admission medications    Medication Sig Start Date End Date Taking? Authorizing Provider  aspirin EC 81 MG tablet Take 81 mg by mouth daily.   Yes [provider]  bisoprolol-hydrochlorothiazide (ZIAC) 5-6.25 MG tablet Take 1 tablet by mouth daily. 10/26/20  Yes [provider]  Cholecalciferol (D3 ADULT PO) Take 1 tablet by mouth daily.   Yes [provider]  doxazosin (CARDURA) 2 MG tablet Take 2 mg by mouth every evening. 10/29/21  Yes [provider]  Ferrous Sulfate 142 (45 Fe) MG TBCR Take 45 mg by mouth daily.   Yes [provider]  latanoprost (XALATAN) 0.005 % ophthalmic solution Place 1 drop into both eyes at bedtime.   Yes [provider]  Omega-3 Fatty Acids (FISH OIL) 1200 MG CAPS Take 1,200 mg by mouth 2 (two) times daily.   Yes [provider]  omeprazole (PRILOSEC) 20 MG capsule Take 20 mg by mouth daily. 10/25/16  Yes [provider]  tiZANidine (ZANAFLEX) 2 MG tablet Take 2 mg by mouth at bedtime as needed for muscle spasms. 10/19/21   [provider]   DG FEMUR MIN 2 VIEWS LEFT  Result Date: 11/24/2021 CLINICAL DATA:  Hip fracture. EXAM: LEFT FEMUR 2 VIEWS COMPARISON:  Pelvis and left hip radiographs 11/24/2021 FINDINGS: Redemonstration of vertical oriented fracture of the left femoral neck with mild superior displacement of the distal fracture,  with respect to the proximal fracture component, unchanged. Mild left femoroacetabular osteoarthritis. Mild-to-moderate partially visualized left knee osteoarthritis. Mild chronic enthesopathic change at the quadriceps insertion on the patella. Brachytherapy seeds are again seen. IMPRESSION: Redemonstration of vertically oriented fracture of the left femoral neck. No additional fracture is seen within the more distal left femur. Electronically Signed   By: Yvonne Kendall M.D.   On: 11/24/2021 16:05   DG Chest 1 View  Result Date: 11/24/2021 CLINICAL DATA:  Fall at home today.  Left  hip fracture. EXAM: CHEST  1 VIEW COMPARISON:  Radiographs 12/24/2016. FINDINGS: 1404 hours. Mild patient rotation to the right. Allowing for this, the heart size and mediastinal contours are stable. There is mild chronic scarring at the left lung base. No confluent airspace opacity, pleural effusion or pneumothorax identified. Old left-sided rib fractures are noted with adjacent pleural thickening. No acute fractures are seen within the visualized chest. Telemetry leads overlie the chest. IMPRESSION: No evidence of acute chest injury. Old left-sided rib fractures and left basilar scarring. Electronically Signed   By: Richardean Sale M.D.   On: 11/24/2021 14:31   DG Hip Unilat With Pelvis 2-3 Views Left  Result Date: 11/24/2021 CLINICAL DATA:  Left hip pain after falling at home today. EXAM: DG HIP (WITH OR WITHOUT PELVIS) 2-3V LEFT COMPARISON:  Abdominal radiograph 07/31/2020.  Pelvic CT 12/24/2016. FINDINGS: The bones are demineralized. There is a superiorly displaced fracture through the mid left femoral neck which appears acute. No other evidence of acute fracture or dislocation. No osseous metastases are identified. There are mild degenerative changes of both hips and sacroiliac joints. The sacroiliac joints appear partially ankylosed on prior CT. Asymmetric ossification of the right ligament. Prostate brachytherapy seeds and postsurgical clips in the right inguinal region are noted. IMPRESSION: Acute, superiorly displaced fracture of the left femoral neck. Electronically Signed   By: Richardean Sale M.D.   On: 11/24/2021 14:30   CT Head Wo Contrast  Result Date: 11/24/2021 CLINICAL DATA:  Provided history: Head trauma, minor. EXAM: CT HEAD WITHOUT CONTRAST TECHNIQUE: Contiguous axial images were obtained from the base of the skull through the vertex without intravenous contrast. RADIATION DOSE REDUCTION: This exam was performed according to the departmental dose-optimization program which includes  automated exposure control, adjustment of the mA and/or kV according to patient size and/or use of iterative reconstruction technique. COMPARISON:  No pertinent prior exams available for comparison. FINDINGS: Brain: Generalized cerebral atrophy. Chronic lacunar infarcts within bilateral basal ganglia. Mild patchy and ill-defined hypoattenuation within the cerebral white matter, nonspecific but compatible with chronic small vessel ischemic disease. There is no acute intracranial hemorrhage. No demarcated cortical infarct. No extra-axial fluid collection. No evidence of an intracranial mass. No midline shift. Vascular: No hyperdense vessel. Atherosclerotic calcifications. Skull: No fracture or aggressive osseous lesion. Sinuses/Orbits: No mass or acute finding within the imaged orbits. No significant paranasal sinus disease at the imaged levels. IMPRESSION: No evidence of acute intracranial abnormality. Parenchymal atrophy and chronic small vessel ischemic disease with chronic lacunar infarcts, as described. Electronically Signed   By: Kellie Simmering D.O.   On: 11/24/2021 14:14    Positive ROS: All other systems have been reviewed and were otherwise negative with the exception of those mentioned in the HPI and as above.  Physical Exam: General:  Alert, no acute distress Psychiatric:  Patient is competent for consent with normal mood and affect   Cardiovascular:  No pedal edema Respiratory:  No wheezing, non-labored breathing GI:  Abdomen is soft and non-tender Skin:  No lesions in the area of chief complaint Neurologic:  Sensation intact distally Lymphatic:  No axillary or cervical lymphadenopathy  Orthopedic Exam:  Left lower extremity  Leg is shortened externally rotated Tenderness to palpation over the left hip Pain with gentle logroll Able to dorsiflex and plantarflex the left foot, able to dorsiflex the left great toe Sensation intact over the foot and lower extremity Good DP pulse and PT pulse  left foot   X-rays:  AP and lateral x-rays of the left hip and femur reviewed which show a displaced femoral neck fracture, no other fractures noted in the distal femur no other implants or hardware noted in the femur. There is some mild superior joint space narrowing and sclerosis noted to the acetabulum.  Assessment: Left femoral neck fracture  Plan: Brayant is a 89 year old male who presents with left neck fracture. I have recommended a left hip hemiarthroplasty for this patient. A long discussion took place with the patient describing what a arthroplasty is and what the procedure would entail. The xrays were reviewed with the patient and the implants were discussed. The ability to secure the implant utilizing cement or cementless (press fit) fixation was discussed. Surgical exposures were discussed with the patient as well as specific risks of using cement during surgery.   The hospitalization and post-operative care and rehabilitation were also discussed. The use of perioperative antibiotics and DVT prophylaxis were discussed. The risk, benefits and alternatives to a surgical intervention were discussed at length with the patient. The patient was also advised of risks related to the medical comorbidities and elevated body mass index (BMI). A lengthy discussion took place to review the most common complications including but not limited to: deep vein thrombosis, pulmonary embolus, heart attack, stroke, infection, wound breakdown, dislocation, numbness, leg length in-equality, damage to nerves, tendon,muscles, arteries or other blood vessels, death and other possible complications from anesthesia. The patient was told that we will take steps to minimize these risks by using sterile technique, antibiotics and DVT prophylaxis when appropriate and follow the patient postoperatively in the office setting to monitor progress. The possibility of recurrent pain, no improvement in pain and actual worsening of  pain were also discussed with the patient. The risk of dislocation following surgery was discussed and potential precautions to prevent dislocation were reviewed.    The benefits of surgery were discussed with the patient including the potential for improving the patient's current clinical condition through operative intervention. Alternatives to surgical intervention including conservative management and bedrest were also discussed in detail. All questions were answered to the satisfaction of the patient. The patient participated and agreed to the plan of care as well as the use of the recommended implants for their hip surgery.    Plan to go to the operating room tomorrow 11/25/2021 for left hip hemiarthroplasty posterior approach possible cemented component  N.p.o. after midnight for surgery tomorrow  1 dose of heparin today hold any further anticoagulation after midnight  Medical admission and clearance for surgery  Consent for surgery is signed and on the chart    Steffanie Rainwater MD  Beeper #:  [phone]  11/24/2021 5:35 PM

## 2021-11-24 NOTE — Assessment & Plan Note (Signed)
Patient presenting with left femoral neck fracture in the setting of mechanical fall.  Orthopedic surgery planning to take patient to the OR tomorrow morning.  - Orthopedic surgery following; appreciate their recommendations - N.p.o. at midnight - Norco as needed for severe pain with Dilaudid for breakthrough pain - Hold heparin after midnight - Tizanidine as needed for muscle spasms

## 2021-11-24 NOTE — ED Triage Notes (Signed)
[facility] reports pt coming from home for a fall. Pt missed a step and fell on left side hitting his head. No LOC, no blood thinners. C/o left hip pain. Shortening and rotation per EMS. Fentanyl given by EMS.

## 2021-11-25 ENCOUNTER — Encounter: Payer: Self-pay | Admitting: Internal Medicine

## 2021-11-25 ENCOUNTER — Inpatient Hospital Stay: Payer: Medicare Other | Admitting: Certified Registered Nurse Anesthetist

## 2021-11-25 ENCOUNTER — Encounter
Admission: EM | Disposition: A | Discharge status: Home Health | Payer: Self-pay | Source: Home / Self Care | Attending: Student in an Organized Health Care Education/Training Program

## 2021-11-25 ENCOUNTER — Inpatient Hospital Stay: Payer: Medicare Other

## 2021-11-25 ENCOUNTER — Other Ambulatory Visit: Payer: Self-pay

## 2021-11-25 DIAGNOSIS — S72002A Fracture of unspecified part of neck of left femur, initial encounter for closed fracture: Secondary | ICD-10-CM | POA: Diagnosis not present

## 2021-11-25 DIAGNOSIS — W19XXXA Unspecified fall, initial encounter: Secondary | ICD-10-CM | POA: Diagnosis not present

## 2021-11-25 DIAGNOSIS — I10 Essential (primary) hypertension: Secondary | ICD-10-CM | POA: Diagnosis not present

## 2021-11-25 HISTORY — PX: HIP ARTHROPLASTY: SHX981

## 2021-11-25 LAB — CBC
HCT: 36.9 % — ABNORMAL LOW (ref 39.0–52.0)
Hemoglobin: 12.7 g/dL — ABNORMAL LOW (ref 13.0–17.0)
MCH: 31.9 pg (ref 26.0–34.0)
MCHC: 34.4 g/dL (ref 30.0–36.0)
MCV: 92.7 fL (ref 80.0–100.0)
Platelets: 144 10*3/uL — ABNORMAL LOW (ref 150–400)
RBC: 3.98 MIL/uL — ABNORMAL LOW (ref 4.22–5.81)
RDW: 13.2 % (ref 11.5–15.5)
WBC: 6.1 10*3/uL (ref 4.0–10.5)
nRBC: 0 % (ref 0.0–0.2)

## 2021-11-25 LAB — BASIC METABOLIC PANEL
Anion gap: 7 (ref 5–15)
BUN: 20 mg/dL (ref 8–23)
CO2: 27 mmol/L (ref 22–32)
Calcium: 8.5 mg/dL — ABNORMAL LOW (ref 8.9–10.3)
Chloride: 107 mmol/L (ref 98–111)
Creatinine, Ser: 1.3 mg/dL — ABNORMAL HIGH (ref 0.61–1.24)
GFR, Estimated: 52 mL/min — ABNORMAL LOW (ref >60)
Glucose, Bld: 110 mg/dL — ABNORMAL HIGH (ref 70–99)
Potassium: 3.9 mmol/L (ref 3.5–5.1)
Sodium: 141 mmol/L (ref 135–145)

## 2021-11-25 LAB — TYPE AND SCREEN
ABO/RH(D): O POS
Antibody Screen: NEGATIVE

## 2021-11-25 LAB — SURGICAL PCR SCREEN
MRSA, PCR: NEGATIVE
Staphylococcus aureus: NEGATIVE

## 2021-11-25 SURGERY — HEMIARTHROPLASTY, HIP, DIRECT ANTERIOR APPROACH, FOR FRACTURE
Anesthesia: General | Site: Hip | Laterality: Left

## 2021-11-25 MED ORDER — PANTOPRAZOLE SODIUM 40 MG PO TBEC: 40.0000 mg | DELAYED_RELEASE_TABLET | Freq: Every day | ORAL | Status: DC

## 2021-11-25 MED ORDER — DEXAMETHASONE SODIUM PHOSPHATE 4 MG/ML IJ SOLN
INTRAMUSCULAR | Status: AC
  Filled 2021-11-25: qty 1

## 2021-11-25 MED ORDER — CHLORHEXIDINE GLUCONATE 0.12 % MT SOLN: 15.0000 mL | Freq: Once | OROMUCOSAL | Status: AC

## 2021-11-25 MED ORDER — DOCUSATE SODIUM 100 MG PO CAPS
100.0000 mg | ORAL_CAPSULE | Freq: Two times a day (BID) | ORAL | Status: DC
  Administered 2021-11-25: 100 mg via ORAL
  Filled 2021-11-25: qty 1

## 2021-11-25 MED ORDER — SODIUM CHLORIDE 0.9 % IR SOLN
Status: DC | PRN
  Administered 2021-11-25: 3000 mL
  Administered 2021-11-25: 100 mL

## 2021-11-25 MED ORDER — KETAMINE HCL 50 MG/5ML IJ SOSY
PREFILLED_SYRINGE | INTRAMUSCULAR | Status: AC
  Filled 2021-11-25: qty 5

## 2021-11-25 MED ORDER — BUPIVACAINE LIPOSOME 1.3 % IJ SUSP
INTRAMUSCULAR | Status: AC
  Filled 2021-11-25: qty 20

## 2021-11-25 MED ORDER — METOCLOPRAMIDE HCL 10 MG PO TABS: 5.0000 mg | ORAL_TABLET | Freq: Three times a day (TID) | ORAL | Status: DC | PRN

## 2021-11-25 MED ORDER — ACETAMINOPHEN 500 MG PO TABS: 1000.0000 mg | ORAL_TABLET | Freq: Three times a day (TID) | ORAL | Status: DC

## 2021-11-25 MED ORDER — METOCLOPRAMIDE HCL 5 MG/ML IJ SOLN: 5.0000 mg | Freq: Three times a day (TID) | INTRAMUSCULAR | Status: DC | PRN

## 2021-11-25 MED ORDER — MENTHOL 3 MG MT LOZG: 1.0000 | LOZENGE | OROMUCOSAL | Status: DC | PRN

## 2021-11-25 MED ORDER — HYDROCODONE-ACETAMINOPHEN 5-325 MG PO TABS
1.0000 | ORAL_TABLET | ORAL | Status: DC | PRN
  Administered 2021-11-26 – 2021-11-27 (×2): 1 via ORAL
  Filled 2021-11-25 (×2): qty 1

## 2021-11-25 MED ORDER — PROPOFOL 1000 MG/100ML IV EMUL
INTRAVENOUS | Status: AC
  Filled 2021-11-25: qty 100

## 2021-11-25 MED ORDER — ONDANSETRON HCL 4 MG/2ML IJ SOLN
INTRAMUSCULAR | Status: DC | PRN
  Administered 2021-11-25: 4 mg via INTRAVENOUS

## 2021-11-25 MED ORDER — ROCURONIUM BROMIDE 10 MG/ML (PF) SYRINGE
PREFILLED_SYRINGE | INTRAVENOUS | Status: AC
  Filled 2021-11-25: qty 10

## 2021-11-25 MED ORDER — TRANEXAMIC ACID-NACL 1000-0.7 MG/100ML-% IV SOLN
INTRAVENOUS | Status: AC
  Filled 2021-11-25: qty 100

## 2021-11-25 MED ORDER — KETAMINE HCL 10 MG/ML IJ SOLN
INTRAMUSCULAR | Status: DC | PRN
  Administered 2021-11-25 (×2): 10 mg via INTRAVENOUS
  Administered 2021-11-25: 20 mg via INTRAVENOUS

## 2021-11-25 MED ORDER — SODIUM CHLORIDE (PF) 0.9 % IJ SOLN
INTRAMUSCULAR | Status: DC | PRN
  Administered 2021-11-25: 70 mL via INTRAMUSCULAR

## 2021-11-25 MED ORDER — LACTATED RINGERS IV SOLN: INTRAVENOUS | Status: DC | PRN

## 2021-11-25 MED ORDER — TRANEXAMIC ACID-NACL 1000-0.7 MG/100ML-% IV SOLN
1000.0000 mg | INTRAVENOUS | Status: AC
  Administered 2021-11-25: 1000 mg via INTRAVENOUS

## 2021-11-25 MED ORDER — CHLORHEXIDINE GLUCONATE 0.12 % MT SOLN
OROMUCOSAL | Status: AC
  Administered 2021-11-25: 15 mL via OROMUCOSAL
  Filled 2021-11-25: qty 15

## 2021-11-25 MED ORDER — SURGIPHOR WOUND IRRIGATION SYSTEM - OPTIME: TOPICAL | Status: DC | PRN

## 2021-11-25 MED ORDER — SODIUM CHLORIDE 0.9 % IV SOLN: INTRAVENOUS | Status: DC

## 2021-11-25 MED ORDER — PROPOFOL 10 MG/ML IV BOLUS
INTRAVENOUS | Status: DC | PRN
  Administered 2021-11-25: 100 mg via INTRAVENOUS

## 2021-11-25 MED ORDER — FENTANYL CITRATE (PF) 100 MCG/2ML IJ SOLN
INTRAMUSCULAR | Status: AC
  Filled 2021-11-25: qty 2

## 2021-11-25 MED ORDER — 0.9 % SODIUM CHLORIDE (POUR BTL) OPTIME
TOPICAL | Status: DC | PRN
  Administered 2021-11-25: 500 mL

## 2021-11-25 MED ORDER — ONDANSETRON HCL 4 MG/2ML IJ SOLN: 4.0000 mg | Freq: Four times a day (QID) | INTRAMUSCULAR | Status: DC | PRN

## 2021-11-25 MED ORDER — SUCCINYLCHOLINE CHLORIDE 200 MG/10ML IV SOSY
PREFILLED_SYRINGE | INTRAVENOUS | Status: DC | PRN
  Administered 2021-11-25: 120 mg via INTRAVENOUS

## 2021-11-25 MED ORDER — CEFAZOLIN SODIUM-DEXTROSE 2-4 GM/100ML-% IV SOLN
INTRAVENOUS | Status: AC
  Administered 2021-11-25: 2 g via INTRAVENOUS
  Filled 2021-11-25: qty 100

## 2021-11-25 MED ORDER — FENTANYL CITRATE (PF) 100 MCG/2ML IJ SOLN: 25.0000 ug | INTRAMUSCULAR | Status: DC | PRN

## 2021-11-25 MED ORDER — ADULT MULTIVITAMIN W/MINERALS CH
1.0000 | ORAL_TABLET | Freq: Every day | ORAL | Status: DC
  Administered 2021-11-26 – 2021-11-27 (×2): 1 via ORAL
  Filled 2021-11-25 (×2): qty 1

## 2021-11-25 MED ORDER — ONDANSETRON HCL 4 MG/2ML IJ SOLN
4.0000 mg | Freq: Once | INTRAMUSCULAR | Status: AC
  Administered 2021-11-25: 4 mg via INTRAVENOUS

## 2021-11-25 MED ORDER — PHENYLEPHRINE HCL (PRESSORS) 10 MG/ML IV SOLN
INTRAVENOUS | Status: DC | PRN
  Administered 2021-11-25 (×2): 80 ug via INTRAVENOUS

## 2021-11-25 MED ORDER — PHENOL 1.4 % MT LIQD: 1.0000 | OROMUCOSAL | Status: DC | PRN

## 2021-11-25 MED ORDER — DEXAMETHASONE SODIUM PHOSPHATE 4 MG/ML IJ SOLN
INTRAMUSCULAR | Status: DC | PRN
  Administered 2021-11-25: 4 mg via INTRAVENOUS

## 2021-11-25 MED ORDER — ONDANSETRON HCL 4 MG/2ML IJ SOLN
INTRAMUSCULAR | Status: AC
  Filled 2021-11-25: qty 2

## 2021-11-25 MED ORDER — SODIUM CHLORIDE FLUSH 0.9 % IV SOLN
INTRAVENOUS | Status: AC
  Filled 2021-11-25: qty 20

## 2021-11-25 MED ORDER — TRANEXAMIC ACID-NACL 1000-0.7 MG/100ML-% IV SOLN
INTRAVENOUS | Status: AC
  Administered 2021-11-25: 1000 mg via INTRAVENOUS
  Filled 2021-11-25: qty 100

## 2021-11-25 MED ORDER — ROCURONIUM 10MG/ML (10ML) SYRINGE FOR MEDFUSION PUMP - OPTIME
INTRAVENOUS | Status: DC | PRN
  Administered 2021-11-25: 20 mg via INTRAVENOUS
  Administered 2021-11-25: 30 mg via INTRAVENOUS
  Administered 2021-11-25: 10 mg via INTRAVENOUS
  Administered 2021-11-25: 5 mg via INTRAVENOUS

## 2021-11-25 MED ORDER — BUPIVACAINE-EPINEPHRINE (PF) 0.25% -1:200000 IJ SOLN
INTRAMUSCULAR | Status: AC
  Filled 2021-11-25: qty 30

## 2021-11-25 MED ORDER — TRANEXAMIC ACID-NACL 1000-0.7 MG/100ML-% IV SOLN: 1000.0000 mg | Freq: Once | INTRAVENOUS | Status: AC

## 2021-11-25 MED ORDER — CEFAZOLIN SODIUM-DEXTROSE 2-4 GM/100ML-% IV SOLN
2.0000 g | Freq: Three times a day (TID) | INTRAVENOUS | Status: AC
  Administered 2021-11-26: 2 g via INTRAVENOUS
  Filled 2021-11-25 (×2): qty 100

## 2021-11-25 MED ORDER — TRAMADOL HCL 50 MG PO TABS: 50.0000 mg | ORAL_TABLET | Freq: Four times a day (QID) | ORAL | Status: DC | PRN

## 2021-11-25 MED ORDER — SUGAMMADEX SODIUM 200 MG/2ML IV SOLN
INTRAVENOUS | Status: DC | PRN
  Administered 2021-11-25: 200 mg via INTRAVENOUS

## 2021-11-25 MED ORDER — CEFAZOLIN SODIUM-DEXTROSE 2-4 GM/100ML-% IV SOLN
2.0000 g | Freq: Once | INTRAVENOUS | Status: AC
  Administered 2021-11-25: 2 g via INTRAVENOUS

## 2021-11-25 MED ORDER — FENTANYL CITRATE (PF) 100 MCG/2ML IJ SOLN
INTRAMUSCULAR | Status: DC | PRN
  Administered 2021-11-25 (×4): 25 ug via INTRAVENOUS

## 2021-11-25 MED ORDER — MORPHINE SULFATE (PF) 2 MG/ML IV SOLN: 0.5000 mg | INTRAVENOUS | Status: DC | PRN

## 2021-11-25 MED ORDER — ENOXAPARIN SODIUM 40 MG/0.4ML IJ SOSY
40.0000 mg | PREFILLED_SYRINGE | INTRAMUSCULAR | Status: DC
  Administered 2021-11-26 – 2021-11-27 (×2): 40 mg via SUBCUTANEOUS
  Filled 2021-11-25 (×2): qty 0.4

## 2021-11-25 MED ORDER — ONDANSETRON HCL 4 MG PO TABS: 4.0000 mg | ORAL_TABLET | Freq: Four times a day (QID) | ORAL | Status: DC | PRN

## 2021-11-25 MED ORDER — BUPIVACAINE HCL (PF) 0.5 % IJ SOLN
INTRAMUSCULAR | Status: AC
  Filled 2021-11-25: qty 10

## 2021-11-25 MED ORDER — EPHEDRINE SULFATE (PRESSORS) 50 MG/ML IJ SOLN
INTRAMUSCULAR | Status: DC | PRN
  Administered 2021-11-25: 10 mg via INTRAVENOUS
  Administered 2021-11-25: 5 mg via INTRAVENOUS

## 2021-11-25 MED ORDER — ACETAMINOPHEN 10 MG/ML IV SOLN
INTRAVENOUS | Status: DC | PRN
  Administered 2021-11-25: 1000 mg via INTRAVENOUS

## 2021-11-25 SURGICAL SUPPLY — 75 items
BLADE SAGITTAL AGGR TOOTH XLG (BLADE) ×1 IMPLANT
BNDG COHESIVE 6X5 TAN ST LF (GAUZE/BANDAGES/DRESSINGS) ×1 IMPLANT
BUR 4.8X51.2 (BURR) ×1 IMPLANT
BUR 4X45 EGG (BURR) ×1 IMPLANT
CEMENT BONE 40GM (Cement) IMPLANT
CHLORAPREP W/TINT 26 (MISCELLANEOUS) ×2 IMPLANT
COVER BACK TABLE REUSABLE LG (DRAPES) ×1 IMPLANT
COVER SET STULBERG POSITIONER (MISCELLANEOUS) ×1 IMPLANT
DERMABOND ADVANCED .7 DNX12 (GAUZE/BANDAGES/DRESSINGS) ×1 IMPLANT
DRAPE 3/4 80X56 (DRAPES) ×1 IMPLANT
DRAPE HD 5FT BACK TABLE (DRAPES) IMPLANT
DRAPE IMP U-DRAPE 54X76 (DRAPES) ×1 IMPLANT
DRAPE INCISE IOBAN 66X60 STRL (DRAPES) ×1 IMPLANT
DRAPE POUCH INSTRU U-SHP 10X18 (DRAPES) ×1 IMPLANT
DRAPE U-SHAPE 47X51 STRL (DRAPES) ×1 IMPLANT
DRSG MEPILEX SACRM 8.7X9.8 (GAUZE/BANDAGES/DRESSINGS) ×1 IMPLANT
DRSG OPSITE POSTOP 4X10 (GAUZE/BANDAGES/DRESSINGS) ×1 IMPLANT
DRSG OPSITE POSTOP 4X12 (GAUZE/BANDAGES/DRESSINGS) IMPLANT
DRSG OPSITE POSTOP 4X8 (GAUZE/BANDAGES/DRESSINGS) ×1 IMPLANT
ELECT BLADE 6.5 EXT (BLADE) IMPLANT
ELECT REM PT RETURN 9FT ADLT (ELECTROSURGICAL) ×1
ELECTRODE REM PT RTRN 9FT ADLT (ELECTROSURGICAL) ×1 IMPLANT
GLOVE BIO SURGEON STRL SZ8 (GLOVE) ×1 IMPLANT
GLOVE BIOGEL PI IND STRL 8 (GLOVE) ×1 IMPLANT
GLOVE PI ORTHO PRO STRL 7.5 (GLOVE) ×2 IMPLANT
GLOVE PI ORTHO PRO STRL SZ8 (GLOVE) ×2 IMPLANT
GLOVE SURG SYN 7.5  E (GLOVE) ×1
GLOVE SURG SYN 7.5 E (GLOVE) ×1 IMPLANT
GLOVE SURG SYN 7.5 PF PI (GLOVE) ×1 IMPLANT
GOWN STRL REUS W/ TWL LRG LVL3 (GOWN DISPOSABLE) ×1 IMPLANT
GOWN STRL REUS W/ TWL XL LVL3 (GOWN DISPOSABLE) ×2 IMPLANT
GOWN STRL REUS W/TWL LRG LVL3 (GOWN DISPOSABLE) ×1
GOWN STRL REUS W/TWL XL LVL3 (GOWN DISPOSABLE) ×2
HEAD BIPOLAR LOCK UHR 28X52 (Head) IMPLANT
HEAD CERAMIC V40 BIOLOX DEL 28 (Orthopedic Implant) IMPLANT
HOOD PEEL AWAY FLYTE STAYCOOL (MISCELLANEOUS) ×2 IMPLANT
IV NS 100ML SINGLE PACK (IV SOLUTION) IMPLANT
IV NS IRRIG 3000ML ARTHROMATIC (IV SOLUTION) ×1 IMPLANT
KIT PREP HIP W/CEMENT RESTRICT (Miscellaneous) IMPLANT
KIT PREPARATION TOTAL HIP (Miscellaneous) ×1 IMPLANT
KIT TURNOVER KIT A (KITS) ×1 IMPLANT
MANIFOLD NEPTUNE II (INSTRUMENTS) ×1 IMPLANT
MAT ABSORB  FLUID 56X50 GRAY (MISCELLANEOUS) ×1
MAT ABSORB FLUID 56X50 GRAY (MISCELLANEOUS) ×1 IMPLANT
NDL SPNL 20GX3.5 QUINCKE YW (NEEDLE) ×1 IMPLANT
NEEDLE SPNL 20GX3.5 QUINCKE YW (NEEDLE) ×1 IMPLANT
PACK HIP PROSTHESIS (MISCELLANEOUS) ×1 IMPLANT
PENCIL SMOKE EVACUATOR (MISCELLANEOUS) ×1 IMPLANT
PENCIL SMOKE EVACUATOR COATED (MISCELLANEOUS) ×1 IMPLANT
PILLOW ABDUCTION FOAM SM (MISCELLANEOUS) ×1 IMPLANT
PULSAVAC PLUS IRRIG FAN TIP (DISPOSABLE) ×1
RETRIEVER SUT HEWSON (MISCELLANEOUS) ×1 IMPLANT
SLEEVE SCD COMPRESS KNEE MED (STOCKING) ×1 IMPLANT
SOLUTION IRRIG SURGIPHOR (IV SOLUTION) ×1 IMPLANT
SPACER DIST ACCOLADE 6/7 15 OD (Spacer) IMPLANT
STEM FEM CEMT 49X158 SZ6 127D (Stem) IMPLANT
SUT BONE WAX W31G (SUTURE) ×1 IMPLANT
SUT DVC 2 QUILL PDO  T11 36X36 (SUTURE) ×1
SUT DVC 2 QUILL PDO T11 36X36 (SUTURE) ×1 IMPLANT
SUT ETHIBOND #5 BRAIDED 30INL (SUTURE) ×1 IMPLANT
SUT QUILL MONODERM 3-0 PS-2 (SUTURE) ×1 IMPLANT
SUT VIC AB 0 CT1 36 (SUTURE) ×1 IMPLANT
SUT VIC AB 2-0 CT2 27 (SUTURE) ×1 IMPLANT
SUT VICRYL 1-0 27IN ABS (SUTURE) ×1
SUTURE VICRYL 1-0 27IN ABS (SUTURE) ×1 IMPLANT
SYR 30ML LL (SYRINGE) ×2 IMPLANT
TAPE MICROFOAM 4IN (TAPE) IMPLANT
TIP FAN IRRIG PULSAVAC PLUS (DISPOSABLE) ×1 IMPLANT
TIP IRRIG/SUCT HIGH CAPACITY (MISCELLANEOUS) IMPLANT
TOWEL OR 17X26 4PK STRL BLUE (TOWEL DISPOSABLE) IMPLANT
TOWER CARTRIDGE SMART MIX (DISPOSABLE) IMPLANT
TRAP FLUID SMOKE EVACUATOR (MISCELLANEOUS) ×1 IMPLANT
TUBE KAMVAC SUCTION (TUBING) ×1 IMPLANT
WAND WEREWOLF FASTSEAL 6.0 (MISCELLANEOUS) ×1 IMPLANT
WATER STERILE IRR 1000ML POUR (IV SOLUTION) ×1 IMPLANT

## 2021-11-25 NOTE — Progress Notes (Signed)
PROGRESS NOTE  Craig Ayala    DOB: 03-06-1929, 88 y.o.  EKC:[id number]    Code Status: Full Code   DOA: 11/24/2021   LOS: 1   Brief hospital course  Craig Ayala is a 88 y.o. male with a PMH significant for HTN.  They presented from home to the ED on 11/24/2021 with fall from a step which resulted in a L femoral neck fracture.  In the ED, it was found that they had stable vital signs with the exception of his chronic HTN to 160s/90s.  They were initially treated with pain medications and ortho surgery was consulted.   Patient was admitted to medicine service for further workup and management of femoral neck fracture as outlined in detail below.  11/25/21 -scheduled for fixation of fractue  Assessment & Plan  Principal Problem:   Hip fracture [facility]) Active Problems:   Essential hypertension  Left femoral neck fracture - ortho surgery following, appreciate your care - analgesia - bowel regimen  HTN, chronic, stable - continue home medications  Body mass index is 29.84 kg/m.  VTE ppx:  lovenox  Diet:     Diet   Diet NPO time specified   Consultants: Ortho surgery Subjective 11/25/21    Pt reports 2/10 pain at rest. Anxious to get his procedure over with   Objective   Vitals:   11/24/21 1859 11/24/21 2042 11/24/21 2256 11/25/21 0432  BP: (!) 174/82 (!) 117/57 123/65 (!) 140/64  Pulse: 76 74 68 62  Resp: '16 16 16 18  '$ Temp:  99 F (37.2 C) 98.7 F (37.1 C) 98.7 F (37.1 C)  TempSrc:      SpO2: 95% 93% 94% 95%  Weight:      Height:        Intake/Output Summary (Last 24 hours) at 11/25/2021 0742 Last data filed at 11/25/2021 0432 Gross per 24 hour  Intake --  Output 450 ml  Net -450 ml   Filed Weights   11/24/21 1237  Weight: 99.8 kg     Physical Exam:  General: awake, alert, NAD Respiratory: normal respiratory effort. Cardiovascular: quick capillary refill, normal S1/S2, RRR, no JVD, murmurs Gastrointestinal: soft, NT, ND Nervous: A&O  x3. no gross focal neurologic deficits with exception to injury leg, normal speech Extremities: intact sensation and motor distal to injury.  Skin: dry, intact, normal temperature, normal color. No rashes, lesions or ulcers on exposed skin Psychiatry: normal mood, congruent affect  Labs   I have personally reviewed the following labs and imaging studies CBC    Component Value Date/Time   WBC 6.1 11/25/2021 0437   RBC 3.98 (L) 11/25/2021 0437   HGB 12.7 (L) 11/25/2021 0437   HCT 36.9 (L) 11/25/2021 0437   PLT 144 (L) 11/25/2021 0437   MCV 92.7 11/25/2021 0437   MCH 31.9 11/25/2021 0437   MCHC 34.4 11/25/2021 0437   RDW 13.2 11/25/2021 0437   LYMPHSABS 1.5 11/24/2021 1247   MONOABS 0.5 11/24/2021 1247   EOSABS 0.3 11/24/2021 1247   BASOSABS 0.1 11/24/2021 1247      Latest Ref Rng & Units 11/25/2021    4:37 AM 11/24/2021   12:47 PM 12/26/2016    5:54 AM  BMP  Glucose 70 - 99 mg/dL 110  142  148   BUN 8 - 23 mg/dL '20  20  19   '$ Creatinine 0.61 - 1.24 mg/dL 1.30  1.21  1.09   Sodium 135 - 145 mmol/L 141  141  135   Potassium 3.5 - 5.1 mmol/L 3.9  4.5  3.9   Chloride 98 - 111 mmol/L 107  107  102   CO2 22 - 32 mmol/L '27  27  25   '$ Calcium 8.9 - 10.3 mg/dL 8.5  8.9  8.6     DG FEMUR MIN 2 VIEWS LEFT  Result Date: 11/24/2021 CLINICAL DATA:  Hip fracture. EXAM: LEFT FEMUR 2 VIEWS COMPARISON:  Pelvis and left hip radiographs 11/24/2021 FINDINGS: Redemonstration of vertical oriented fracture of the left femoral neck with mild superior displacement of the distal fracture, with respect to the proximal fracture component, unchanged. Mild left femoroacetabular osteoarthritis. Mild-to-moderate partially visualized left knee osteoarthritis. Mild chronic enthesopathic change at the quadriceps insertion on the patella. Brachytherapy seeds are again seen. IMPRESSION: Redemonstration of vertically oriented fracture of the left femoral neck. No additional fracture is seen within the more distal  left femur. Electronically Signed   By: Yvonne Kendall M.D.   On: 11/24/2021 16:05   DG Chest 1 View  Result Date: 11/24/2021 CLINICAL DATA:  Fall at home today.  Left hip fracture. EXAM: CHEST  1 VIEW COMPARISON:  Radiographs 12/24/2016. FINDINGS: 1404 hours. Mild patient rotation to the right. Allowing for this, the heart size and mediastinal contours are stable. There is mild chronic scarring at the left lung base. No confluent airspace opacity, pleural effusion or pneumothorax identified. Old left-sided rib fractures are noted with adjacent pleural thickening. No acute fractures are seen within the visualized chest. Telemetry leads overlie the chest. IMPRESSION: No evidence of acute chest injury. Old left-sided rib fractures and left basilar scarring. Electronically Signed   By: Richardean Sale M.D.   On: 11/24/2021 14:31   DG Hip Unilat With Pelvis 2-3 Views Left  Result Date: 11/24/2021 CLINICAL DATA:  Left hip pain after falling at home today. EXAM: DG HIP (WITH OR WITHOUT PELVIS) 2-3V LEFT COMPARISON:  Abdominal radiograph 07/31/2020.  Pelvic CT 12/24/2016. FINDINGS: The bones are demineralized. There is a superiorly displaced fracture through the mid left femoral neck which appears acute. No other evidence of acute fracture or dislocation. No osseous metastases are identified. There are mild degenerative changes of both hips and sacroiliac joints. The sacroiliac joints appear partially ankylosed on prior CT. Asymmetric ossification of the right ligament. Prostate brachytherapy seeds and postsurgical clips in the right inguinal region are noted. IMPRESSION: Acute, superiorly displaced fracture of the left femoral neck. Electronically Signed   By: Richardean Sale M.D.   On: 11/24/2021 14:30   CT Head Wo Contrast  Result Date: 11/24/2021 CLINICAL DATA:  Provided history: Head trauma, minor. EXAM: CT HEAD WITHOUT CONTRAST TECHNIQUE: Contiguous axial images were obtained from the base of the skull  through the vertex without intravenous contrast. RADIATION DOSE REDUCTION: This exam was performed according to the departmental dose-optimization program which includes automated exposure control, adjustment of the mA and/or kV according to patient size and/or use of iterative reconstruction technique. COMPARISON:  No pertinent prior exams available for comparison. FINDINGS: Brain: Generalized cerebral atrophy. Chronic lacunar infarcts within bilateral basal ganglia. Mild patchy and ill-defined hypoattenuation within the cerebral white matter, nonspecific but compatible with chronic small vessel ischemic disease. There is no acute intracranial hemorrhage. No demarcated cortical infarct. No extra-axial fluid collection. No evidence of an intracranial mass. No midline shift. Vascular: No hyperdense vessel. Atherosclerotic calcifications. Skull: No fracture or aggressive osseous lesion. Sinuses/Orbits: No mass or acute finding within the imaged orbits. No significant paranasal sinus disease at  the imaged levels. IMPRESSION: No evidence of acute intracranial abnormality. Parenchymal atrophy and chronic small vessel ischemic disease with chronic lacunar infarcts, as described. Electronically Signed   By: Kellie Simmering D.O.   On: 11/24/2021 14:14    Disposition Plan & Communication  Patient status: Inpatient  Admitted From: Home Planned disposition location: Home health Anticipated discharge date: 10/29 pending recovery from surgery  Family Communication: none at bedside     Author: Richarda Osmond, DO [facility] 11/25/2021, 7:42 AM   Available by Epic secure chat 7AM-7PM. If 7PM-7AM, please contact night-coverage.  [facility] contact information found on [email or web address].

## 2021-11-25 NOTE — Op Note (Signed)
Patient Name: Craig Ayala  IFO:[id number]  Pre-Operative Diagnosis: Left Femoral Neck Fracture  Post-Operative Diagnosis: Left Femoral Neck Fracture  Procedure: Left Hip Hemiarthroplasty  Components/Implants:  Stem: Cemented Accolade C 127 deg size 6 with 376m distal spacer  Head: 5976m28mm +76m39mipolar Head with ceramic inner ball  Date of Surgery: 11/25/2021  Surgeon: ZacSteffanie Rainwater  Assistant: ThoDorise Hiss (present and scrubbed throughout the case, critical for assistance with exposure, retraction, instrumentation, and closure)   Anesthesiologist: Karenz/Woodson  Anesthesia: General   IVFMVE:[id number]: [id number]: None   Brief history: The patient is a 89 66ar old male who presented after a mechanical fall to the ED and found to have a left femoral neck fracture. The risks and benefits of left hip hemiarthroplasty as definitive surgical treatment were discussed with the patient, who opted to proceed with the operation.   All preoperative films were reviewed and an appropriate surgical plan was made prior to surgery.   Description of procedure: The patient was brought to the operating room where laterality was confirmed by all those present to be the left side.  The patient was moved to the table and administered General anesthesia after multiple attempts at spinal anesthesia were unsuccessful. Patient was given an intravenous dose of antibiotics for surgical prophylaxis and TXA. The patient was positioned in lateral decubitus position with all bony prominences well-padded.  Surgical site was prepped with alcohol and chlorhexidine.  Surgical site over the hip was draped in typical sterile fashion with multiple layers of adhesive and nonadhesive drapes.  The incision site over the greater trochanter posteriorly was marked out with a sterile marker.   Surgical timeout was then called with participation of all staff in the room the patient was confirmed and laterality  again confirmed.  An incision was made over the lateral aspect of the hip cheating posteriorly on the proximal aspect.  Careful soft tissue dissection and coagulation of all bleeders was carried out down to the level of the glut max fascia.  The fascia was carefully incised in line with the femur.  A Charnley retractor was placed deep to the fascia with care taken to ensure that there was no nerve entrapment in the retractor.  The bursa tissue was taken down over the posterior femur exposing the external rotators.  A dull Cobra retractor was placed under the abductor mechanism to protect the mechanism and fully expose the piriformis and short external rotators.  The external rotators were carefully detached from the femur with electrocautery and tagged with Ethibond sutures.  The capsule to the hip was incised and tagged with sutures. Care was taken to preserve the labral tissue around the acetabulum. The fracture was encountered at this time and a freshen up cut of the femoral neck was completed with the protection of cobra retractors on either side of the femoral neck. The broken femoral head was then removed from the acetabulum with a corkscrew and a cobb. The cup was irrigated and cleared of fracture debris. There was found to be minimal arthritic changes noted to the femoral head and it measured to 47m6mameter with a caliper. A 47mm86mal ball was placed into the acetabulum and found to provide good suction fit and fill.   Attention was turned back to the femur and a femoral neck retractor was placed.  The femur was opened with a box osteotome and canal finder. The femur was reamed and then then sequentially broached up to a size 6 broach which  allowed for good fit and fill.  A trial neck and head were then attached and the hip was reduced.  The hip was found to be stable on reduction with full range of motion without subluxation or dislocation and leg lengths felt equal.  The hip was then carefully  dislocated head and neck trial was removed.   The femur was then re-exposed the broach was removed the canal was irrigated with pulsatile lavage and a cement restrictor was placed distally. Anesthesia was made aware of cementing prior to beginning. The femur was carefully scrubbed and dried and then filled and pressurized with cement. The femoral implant was then carefully impacted into place removing excess cement. After allowing time for the cement to set a trial ball was attached and the hip was found the be stable through range of motion with equal leg lengths and good abductor tension. The hip was dislocated the trunion cleaned and the real ball was then placed on the femoral component and impacted into place.  The acetabulum was irrigated and the hip was reduced.  The hip showed good range of motion and stability on testing with both stability in flexion internal rotation and extension external rotation.  The hip was then irrigated with surgiphor and pulsatile lavage.  A 2 mm drill bit was then used to make 2 holes in the greater trochanter the piriformis and capsular tissues were reapproximated and passed through the drill holes and tied over the greater trochanter. The pericapsular and subcutaneous tissues were injected with a periarticular cocktail. The fascia was then approximated with #1 Vicryl and #2 barbed suture.  The subcutaneous tissues and skin were closed with 0 Vicryl 2-0 Vicryl and 3-0 monoderm quill suture and the skin closed with Dermabond.  A sterile dressing was then applied.  Lap, sharps, and sponge counts were correct at the end of the case.   The patient was then rolled supine and an x-ray was taken in the operating room. Leg lengths were clinically equal on examination. Components appeared in good position with no fractures noted on x-ray.  Patient was then transferred to a hospital bed and transferred to the recovery room in stable condition.

## 2021-11-25 NOTE — Anesthesia Preprocedure Evaluation (Signed)
Anesthesia Evaluation  Patient identified by MRN, date of birth, ID band Patient awake    Reviewed: Allergy & Precautions, H&P , NPO status , Patient's Chart, lab work & pertinent test results  History of Anesthesia Complications Negative for: history of anesthetic complications  Airway Mallampati: III  TM Distance: <3 FB Neck ROM: limited    Dental  (+) Chipped, Poor Dentition, Caps, Missing, Dental Advidsory Given   Pulmonary neg shortness of breath, former smoker,           Cardiovascular Exercise Tolerance: Good (-) angina(-) Past MI and (-) DOE negative cardio ROS       Neuro/Psych negative psych ROS   GI/Hepatic Neg liver ROS, PUD, GERD  Medicated and Controlled,  Endo/Other  negative endocrine ROS  Renal/GU Renal disease     Musculoskeletal   Abdominal   Peds  Hematology negative hematology ROS (+)   Anesthesia Other Findings Past Medical History: No date: Aneurysmal subarachnoid hemorrhage (HCC) No date: Cancer ([place])     Comment:  squamous cell No date: Gout No date: Kidney stone No date: Peptic ulcer No date: Pilonidal cyst No date: Spontaneous pneumothorax  Past Surgical History: No date: UPPER GASTROINTESTINAL ENDOSCOPY  BMI    Body Mass Index:  38.28 kg/m      Reproductive/Obstetrics negative OB ROS                             Anesthesia Physical  Anesthesia Plan  ASA: 3  Anesthesia Plan: Spinal   Post-op Pain Management:    Induction: Intravenous  PONV Risk Score and Plan: 2 and TIVA and Propofol infusion  Airway Management Planned: Natural Airway and Simple Face Mask  Additional Equipment:   Intra-op Plan:   Post-operative Plan:   Informed Consent: I have reviewed the patients History and Physical, chart, labs and discussed the procedure including the risks, benefits and alternatives for the proposed anesthesia with the patient or authorized  representative who has indicated his/her understanding and acceptance.     Dental Advisory Given  Plan Discussed with: Anesthesiologist, CRNA and Surgeon  Anesthesia Plan Comments: (Patient consented for risks of anesthesia including but not limited to:  - adverse reactions to medications - damage to teeth, lips or other oral mucosa - sore throat or hoarseness - Damage to heart, brain, lungs or loss of life  Patient voiced understanding.)        Anesthesia Quick Evaluation

## 2021-11-25 NOTE — Progress Notes (Signed)
Initial Nutrition Assessment  DOCUMENTATION CODES:   Not applicable  INTERVENTION:   -Once diet is advanced, add:   -Magic cup TID with meals, each supplement provides 290 kcal and 9 grams of protein  -MVI with minerals daily  NUTRITION DIAGNOSIS:   Increased nutrient needs related to post-op healing as evidenced by estimated needs.  GOAL:   Patient will meet greater than or equal to 90% of their needs  MONITOR:   PO intake, Supplement acceptance, Diet advancement  REASON FOR ASSESSMENT:   Consult Assessment of nutrition requirement/status, Hip fracture protocol  ASSESSMENT:   Pt with medical history significant of hypertension, aneurysmal SAH (1973), spontaneous pneumothorax (> 50 years ago), prostate cancer who presents after a fall.  Pt admitted with lt hip fracture s/p fall.   Reviewed I/O's: -450 ml x 24 hours  UOP: 450 ml x 24 hours  Pt NPO for lt hemiarthroplasty today.   Spoke with pt at bedside, who was pleasant and in good spirits today. He acknowledges NPO status for upcoming surgery and is anxious for procedure ("it feels like it's been 10:15 for the past 5 hours"). Pt reports good appetite PTA, usually consuming 2 meals per day and a snack. Pt typically does not eat a morning meal as he gets up late ("I'm all about the early bird and senior citizen specials"). Per pt, he eats his meals mostly at home.   Pt denies any weight loss. Wt has been stable over the past year.   Discussed importance of good meal and supplement intake to promote healing. Pt amenable to supplements.   Medications reviewed.   Labs reviewed.   NUTRITION - FOCUSED PHYSICAL EXAM:  Flowsheet Row Most Recent Value  Orbital Region No depletion  Upper Arm Region No depletion  Thoracic and Lumbar Region No depletion  Buccal Region No depletion  Temple Region No depletion  Clavicle Bone Region No depletion  Clavicle and Acromion Bone Region No depletion  Scapular Bone Region No  depletion  Dorsal Hand No depletion  Patellar Region No depletion  Anterior Thigh Region No depletion  Posterior Calf Region No depletion  Edema (RD Assessment) Mild  Hair Reviewed  Eyes Reviewed  Mouth Reviewed  Skin Reviewed  Nails Reviewed       Diet Order:   Diet Order             Diet regular Room service appropriate? Yes; Fluid consistency: Thin  Diet effective ____                   EDUCATION NEEDS:   Education needs have been addressed  Skin:  Skin Assessment: Reviewed RN Assessment  Last BM:  11/23/21  Height:   Ht Readings from Last 1 Encounters:  11/25/21 6' (1.829 m)    Weight:   Wt Readings from Last 1 Encounters:  11/25/21 99.8 kg    Ideal Body Weight:  80.9 kg  BMI:  Body mass index is 29.84 kg/m.  Estimated Nutritional Needs:   Kcal:  2000-2200  Protein:  105-120 grams  Fluid:  > 2 L    Loistine Chance, RD, LDN, [place] Registered Dietitian II Certified Diabetes Care and Education Specialist Please refer to [facility] for RD and/or RD on-call/weekend/after hours pager

## 2021-11-25 NOTE — Anesthesia Postprocedure Evaluation (Signed)
Anesthesia Post Note  Patient: Craig Ayala  Procedure(s) Performed: Posterior ARTHROPLASTY BIPOLAR HIP (HEMIARTHROPLASTY) (Left: Hip)  Patient location during evaluation: PACU Anesthesia Type: General Level of consciousness: awake and alert Pain management: pain level controlled Vital Signs Assessment: post-procedure vital signs reviewed and stable Respiratory status: spontaneous breathing, nonlabored ventilation, respiratory function stable and patient connected to nasal cannula oxygen Cardiovascular status: blood pressure returned to baseline and stable Postop Assessment: no apparent nausea or vomiting Anesthetic complications: no   No notable events documented.   Last Vitals:  Vitals:   11/25/21 1746 11/25/21 2028  BP: 127/63 127/68  Pulse: 66 78  Resp: 16 20  Temp: 36.8 C 36.7 C  SpO2: 94% 96%    Last Pain:  Vitals:   11/25/21 2028  TempSrc: Oral  PainSc:                  Dimas Millin

## 2021-11-25 NOTE — Anesthesia Procedure Notes (Signed)
Procedure Name: Intubation Date/Time: 11/25/2021 1:47 PM  Performed by: Demetrius Charity, CRNAPre-anesthesia Checklist: Patient identified, Patient being monitored, Timeout performed, Emergency Drugs available and Suction available Patient Re-evaluated:Patient Re-evaluated prior to induction Oxygen Delivery Method: Circle system utilized Preoxygenation: Pre-oxygenation with 100% oxygen Induction Type: IV induction Ventilation: Mask ventilation without difficulty Laryngoscope Size: McGraph and 4 Grade View: Grade I Tube type: Oral Tube size: 7.5 mm Number of attempts: 1 Airway Equipment and Method: Stylet Placement Confirmation: ETT inserted through vocal cords under direct vision, positive ETCO2 and breath sounds checked- equal and bilateral Secured at: 23 cm Tube secured with: Tape Dental Injury: Teeth and Oropharynx as per pre-operative assessment

## 2021-11-25 NOTE — Plan of Care (Signed)

## 2021-11-25 NOTE — Transfer of Care (Signed)
Immediate Anesthesia Transfer of Care Note  Patient: Craig Ayala  Procedure(s) Performed: Posterior ARTHROPLASTY BIPOLAR HIP (HEMIARTHROPLASTY) (Left: Hip)  Patient Location: PACU  Anesthesia Type:General  Level of Consciousness: drowsy  Airway & Oxygen Therapy: Patient Spontanous Breathing and Patient connected to face mask oxygen  Post-op Assessment: Report given to RN and Post -op Vital signs reviewed and stable  Post vital signs: Reviewed  Last Vitals:  Vitals Value Taken Time  BP    Temp    Pulse 87 11/25/21 1558  Resp 22 11/25/21 1558  SpO2 96 % 11/25/21 1558  Vitals shown include unvalidated device data.  Last Pain:  Vitals:   11/25/21 1210  TempSrc: Temporal  PainSc: 0-No pain      Patients Stated Pain Goal: 0 (41/36/43 [id number])  Complications: No notable events documented.

## 2021-11-25 NOTE — Interval H&P Note (Signed)
History and Physical Interval Note:  11/25/2021 12:37 PM  Craig Ayala  has presented today for surgery, with the diagnosis of Left Hip Fracture.  The various methods of treatment have been discussed with the patient and family. After consideration of risks, benefits and other options for treatment, the patient has consented to  Procedure(s): Posterior ARTHROPLASTY BIPOLAR HIP (HEMIARTHROPLASTY) (Left) as a surgical intervention.  The patient's history has been reviewed, patient examined, no change in status, stable for surgery.  I have reviewed the patient's chart and labs.  Questions were answered to the patient's satisfaction.     Steffanie Rainwater

## 2021-11-25 NOTE — Care Management Important Message (Signed)
Important Message  Patient Details  Name: Craig Ayala MRN: [id number] Date of Birth: 1929-04-24   Medicare Important Message Given:  N/A - LOS <3 / Initial given by admissions     Dannette Barbara 11/25/2021, 4:53 PM

## 2021-11-26 ENCOUNTER — Encounter: Payer: Self-pay | Admitting: Orthopedic Surgery

## 2021-11-26 DIAGNOSIS — I10 Essential (primary) hypertension: Secondary | ICD-10-CM | POA: Diagnosis not present

## 2021-11-26 DIAGNOSIS — S72002A Fracture of unspecified part of neck of left femur, initial encounter for closed fracture: Secondary | ICD-10-CM | POA: Diagnosis not present

## 2021-11-26 DIAGNOSIS — W19XXXA Unspecified fall, initial encounter: Secondary | ICD-10-CM | POA: Diagnosis not present

## 2021-11-26 LAB — CBC
HCT: 35 % — ABNORMAL LOW (ref 39.0–52.0)
Hemoglobin: 12.1 g/dL — ABNORMAL LOW (ref 13.0–17.0)
MCH: 31.8 pg (ref 26.0–34.0)
MCHC: 34.6 g/dL (ref 30.0–36.0)
MCV: 91.9 fL (ref 80.0–100.0)
Platelets: 143 10*3/uL — ABNORMAL LOW (ref 150–400)
RBC: 3.81 MIL/uL — ABNORMAL LOW (ref 4.22–5.81)
RDW: 13.3 % (ref 11.5–15.5)
WBC: 10.9 10*3/uL — ABNORMAL HIGH (ref 4.0–10.5)
nRBC: 0 % (ref 0.0–0.2)

## 2021-11-26 LAB — BASIC METABOLIC PANEL
Anion gap: 8 (ref 5–15)
BUN: 21 mg/dL (ref 8–23)
CO2: 24 mmol/L (ref 22–32)
Calcium: 7.9 mg/dL — ABNORMAL LOW (ref 8.9–10.3)
Chloride: 106 mmol/L (ref 98–111)
Creatinine, Ser: 1.37 mg/dL — ABNORMAL HIGH (ref 0.61–1.24)
GFR, Estimated: 49 mL/min — ABNORMAL LOW (ref >60)
Glucose, Bld: 214 mg/dL — ABNORMAL HIGH (ref 70–99)
Potassium: 4.1 mmol/L (ref 3.5–5.1)
Sodium: 138 mmol/L (ref 135–145)

## 2021-11-26 MED ORDER — POLYETHYLENE GLYCOL 3350 17 G PO PACK
17.0000 g | PACK | Freq: Two times a day (BID) | ORAL | Status: DC
  Administered 2021-11-26 – 2021-11-27 (×3): 17 g via ORAL
  Filled 2021-11-26 (×3): qty 1

## 2021-11-26 NOTE — Evaluation (Signed)
Occupational Therapy Evaluation Patient Details Name: Craig Ayala MRN: [id number] DOB: Jan 21, 1930 Today's Date: 11/26/2021   History of Present Illness Pt is a 88 yo M diagnosed with a L femoral neck fracture after a fall and is now s/p L hip hemiarthroplasty.  PMH includes: HTN, SAH, spontaneous pneumothoraxand, and prostate CA.   Clinical Impression   Craig Ayala was seen for OT evaluation this date. Prior to hospital admission, pt was IND for all mobility and I/ADLs. Pt lives with spouse with STM deficits, son and DIL visiting from [place] to assist. Pt presents to acute OT demonstrating impaired ADL performance and functional mobility 2/2 decreased activity tolerance and functional strength/ROM/balance deficits.   Pt currently requires MIN A + RW sit<>stand from low chair, initially requires MOD A static standing balance improving to SBA toileting and grooming standing. CGA + RW for ~40 ft toilet t/f. Educated on posterior hip pcns, plan to trial AE next session. Reports 8/10 pain end of session, RN in room to address. Pt would benefit from skilled OT. Upon hospital discharge, recommend STR however if pt is able to have assistance at home and bed on first floor may progress to [place].     Recommendations for follow up therapy are one component of a multi-disciplinary discharge planning process, led by the attending physician.  Recommendations may be updated based on patient status, additional functional criteria and insurance authorization.   Follow Up Recommendations  Skilled nursing-short term rehab (<3 hours/day) (may progress to [facility])    Assistance Recommended at Discharge Intermittent Supervision/Assistance  Patient can return home with the following A little help with walking and/or transfers;A lot of help with bathing/dressing/bathroom;Help with stairs or ramp for entrance    Functional Status Assessment  Patient has had a recent decline in their functional status and demonstrates the  ability to make significant improvements in function in a reasonable and predictable amount of time.  Equipment Recommendations  BSC/3in1    Recommendations for Other Services       Precautions / Restrictions Precautions Precautions: Fall;Posterior Hip Precaution Booklet Issued: Yes (comment) Restrictions Weight Bearing Restrictions: Yes LLE Weight Bearing: Weight bearing as tolerated      Mobility Bed Mobility               General bed mobility comments: NT - received and left sitting    Transfers Overall transfer level: Needs assistance Equipment used: Rolling walker (2 wheels) Transfers: Sit to/from Stand Sit to Stand: Min assist           General transfer comment: from low chair height      Balance Overall balance assessment: Needs assistance Sitting-balance support: Feet unsupported, Single extremity supported Sitting balance-Leahy Scale: Good     Standing balance support: No upper extremity supported, During functional activity Standing balance-Leahy Scale: Fair Standing balance comment: initial MOD A for static balance improving to SBA                           ADL either performed or assessed with clinical judgement   ADL Overall ADL's : Needs assistance/impaired                                       General ADL Comments: MIN A + RW for toilet t/f. SBA toileting and grooming standing. MAX A don B socks, cues to maintain posterior  hip pcns.      Pertinent Vitals/Pain Pain Assessment Pain Assessment: 0-10 Pain Score: 8  Pain Location: L hip Pain Descriptors / Indicators: Aching, Sore Pain Intervention(s): Limited activity within patient's tolerance, Patient requesting pain meds-RN notified, RN gave pain meds during session     Hand Dominance Right   Extremity/Trunk Assessment Upper Extremity Assessment Upper Extremity Assessment: Overall WFL for tasks assessed   Lower Extremity Assessment Lower Extremity  Assessment: Generalized weakness LLE: Unable to fully assess due to pain LLE Sensation: WNL LLE Coordination: WNL       Communication Communication Communication: No difficulties   Cognition Arousal/Alertness: Awake/alert Behavior During Therapy: WFL for tasks assessed/performed Overall Cognitive Status: Within Functional Limits for tasks assessed                                                  Home Living Family/patient expects to be discharged to:: Private residence Living Arrangements: Spouse/significant other Available Help at Discharge: Family;Available PRN/intermittently Type of Home: House Home Access: Stairs to enter [employer] of Steps: 2 Entrance Stairs-Rails: None Home Layout: Two level;Bed/bath upstairs Alternate Level Stairs-Number of Steps: flight Alternate Level Stairs-Rails: Right Bathroom Shower/Tub: Walk-in shower         Home Equipment: Shower seat - built in   Additional Comments: Pt lives with spouse who has some STM issues but may be able to provide light physical assist; pt's son (retired) and DIL are in from [employer]      Prior Functioning/Environment Prior Level of Function : Independent/Modified Independent             Mobility Comments: Ind amb community distances without an AD, no other fall history other than current fall, active, works in the yard, drives ADLs Comments: Ind with ADLs        OT Problem List: Decreased strength;Decreased range of motion;Decreased activity tolerance;Impaired balance (sitting and/or standing);Decreased safety awareness      OT Treatment/Interventions: Self-care/ADL training;Therapeutic exercise;Energy conservation;DME and/or AE instruction;Therapeutic activities;Patient/family education;Balance training    OT Goals(Current goals can be found in the care plan section) Acute Rehab OT Goals Patient Stated Goal: to go home OT Goal Formulation: With patient Time For Goal  Achievement: 12/10/21 Potential to Achieve Goals: Good ADL Goals Pt Will Perform Grooming: with modified independence;standing Pt Will Perform Lower Body Dressing: with modified independence;with caregiver independent in assisting;sit to/from stand;with adaptive equipment Pt Will Transfer to Toilet: with modified independence;ambulating;regular height toilet  OT Frequency: Min 2X/week    Co-evaluation              AM-PAC OT "6 Clicks" Daily Activity     Outcome Measure Help from another person eating meals?: None Help from another person taking care of personal grooming?: A Little Help from another person toileting, which includes using toliet, bedpan, or urinal?: A Little Help from another person bathing (including washing, rinsing, drying)?: A Lot Help from another person to put on and taking off regular upper body clothing?: A Little Help from another person to put on and taking off regular lower body clothing?: A Lot 6 Click Score: 17   End of Session Nurse Communication: Mobility status  Activity Tolerance: Patient tolerated treatment well Patient left: with call bell/phone within reach;in chair;with nursing/sitter in room  OT Visit Diagnosis: Other abnormalities of gait and mobility (R26.89);Muscle weakness (generalized) (M62.81)  Time: 0903-0149 OT Time Calculation (min): 31 min Charges:  OT General Charges $OT Visit: 1 Visit OT Evaluation $OT Eval Moderate Complexity: 1 Mod OT Treatments $Self Care/Home Management : 23-37 mins  Dessie Coma, M.S. OTR/L  11/26/21, 11:53 AM  ascom [phone]

## 2021-11-26 NOTE — Progress Notes (Signed)
PROGRESS NOTE  Craig Ayala    DOB: 04-22-1929, 88 y.o.  QPR:[id number]    Code Status: Full Code   DOA: 11/24/2021   LOS: 2   Brief hospital course  Craig Ayala is a 88 y.o. male with a PMH significant for HTN.  They presented from home to the ED on 11/24/2021 with fall from a step which resulted in a L femoral neck fracture.  In the ED, it was found that they had stable vital signs with the exception of his chronic HTN to 160s/90s.  They were initially treated with pain medications and ortho surgery was consulted.   Patient was admitted to medicine service for further workup and management of femoral neck fracture as outlined in detail below.  11/26/21 -scheduled for fixation of fractue  Assessment & Plan  Principal Problem:   Hip fracture [facility]) Active Problems:   Essential hypertension   Fall  Left femoral neck fracture s/p fixation 10/26 - ortho surgery following, appreciate your care - analgesia - bowel regimen - PT/OT recommending SNF, TOC engaged  HTN, chronic, stable - continue home medications  Body mass index is 29.84 kg/m.  VTE ppx: enoxaparin (LOVENOX) injection 40 mg Start: 11/26/21 1000 SCDs Start: 11/25/21 1735 lovenox  Diet:     Diet   Diet regular Room service appropriate? Yes; Fluid consistency: Thin   Consultants: Ortho surgery Subjective 11/26/21    Pt reports no complaints. Tolerated transfers well.   Objective   Vitals:   11/25/21 1746 11/25/21 2028 11/25/21 2340 11/26/21 0436  BP: 127/63 127/68 120/70 (!) 141/83  Pulse: 66 78 77 74  Resp: '16 20 17   '$ Temp: 98.2 F (36.8 C) 98 F (36.7 C) 97.9 F (36.6 C) 98.1 F (36.7 C)  TempSrc:  Oral  Oral  SpO2: 94% 96% 95% 95%  Weight:      Height:        Intake/Output Summary (Last 24 hours) at 11/26/2021 0751 Last data filed at 11/26/2021 0428 Gross per 24 hour  Intake 1748.04 ml  Output 1400 ml  Net 348.04 ml    Filed Weights   11/24/21 1237 11/25/21 1210  Weight: 99.8  kg 99.8 kg     Physical Exam:  General: awake, alert, NAD Respiratory: normal respiratory effort. Cardiovascular: quick capillary refill, normal S1/S2, RRR, no JVD, murmurs Gastrointestinal: soft, NT, ND Nervous: A&O x3. no gross focal neurologic deficits with exception to injury leg, normal speech Extremities: intact sensation and motor distal to injury.  Skin: dry, intact, normal temperature, normal color. No rashes, lesions or ulcers on exposed skin Psychiatry: normal mood, congruent affect  Labs   I have personally reviewed the following labs and imaging studies CBC    Component Value Date/Time   WBC 10.9 (H) 11/26/2021 0634   RBC 3.81 (L) 11/26/2021 0634   HGB 12.1 (L) 11/26/2021 0634   HCT 35.0 (L) 11/26/2021 0634   PLT 143 (L) 11/26/2021 0634   MCV 91.9 11/26/2021 0634   MCH 31.8 11/26/2021 0634   MCHC 34.6 11/26/2021 0634   RDW 13.3 11/26/2021 0634   LYMPHSABS 1.5 11/24/2021 1247   MONOABS 0.5 11/24/2021 1247   EOSABS 0.3 11/24/2021 1247   BASOSABS 0.1 11/24/2021 1247      Latest Ref Rng & Units 11/26/2021    6:34 AM 11/25/2021    4:37 AM 11/24/2021   12:47 PM  BMP  Glucose 70 - 99 mg/dL 214  110  142   BUN 8 -  23 mg/dL '21  20  20   '$ Creatinine 0.61 - 1.24 mg/dL 1.37  1.30  1.21   Sodium 135 - 145 mmol/L 138  141  141   Potassium 3.5 - 5.1 mmol/L 4.1  3.9  4.5   Chloride 98 - 111 mmol/L 106  107  107   CO2 22 - 32 mmol/L '24  27  27   '$ Calcium 8.9 - 10.3 mg/dL 7.9  8.5  8.9     DG Pelvis Portable  Result Date: 11/25/2021 CLINICAL DATA:  Hip replacement EXAM: PORTABLE PELVIS 1-2 VIEWS COMPARISON:  11/24/2021 FINDINGS: Interval resection of LEFT femoral head and fractured femoral neck with placement of a LEFT femoral prosthesis. Diffuse osseous demineralization. No fracture or dislocation. Seed implants prostate bed. Remaining osseous structures unremarkable. IMPRESSION: LEFT hip hemiarthroplasty without acute osseous abnormalities. Electronically Signed   By:  Lavonia Dana M.D.   On: 11/25/2021 16:06   DG FEMUR MIN 2 VIEWS LEFT  Result Date: 11/24/2021 CLINICAL DATA:  Hip fracture. EXAM: LEFT FEMUR 2 VIEWS COMPARISON:  Pelvis and left hip radiographs 11/24/2021 FINDINGS: Redemonstration of vertical oriented fracture of the left femoral neck with mild superior displacement of the distal fracture, with respect to the proximal fracture component, unchanged. Mild left femoroacetabular osteoarthritis. Mild-to-moderate partially visualized left knee osteoarthritis. Mild chronic enthesopathic change at the quadriceps insertion on the patella. Brachytherapy seeds are again seen. IMPRESSION: Redemonstration of vertically oriented fracture of the left femoral neck. No additional fracture is seen within the more distal left femur. Electronically Signed   By: Yvonne Kendall M.D.   On: 11/24/2021 16:05   DG Chest 1 View  Result Date: 11/24/2021 CLINICAL DATA:  Fall at home today.  Left hip fracture. EXAM: CHEST  1 VIEW COMPARISON:  Radiographs 12/24/2016. FINDINGS: 1404 hours. Mild patient rotation to the right. Allowing for this, the heart size and mediastinal contours are stable. There is mild chronic scarring at the left lung base. No confluent airspace opacity, pleural effusion or pneumothorax identified. Old left-sided rib fractures are noted with adjacent pleural thickening. No acute fractures are seen within the visualized chest. Telemetry leads overlie the chest. IMPRESSION: No evidence of acute chest injury. Old left-sided rib fractures and left basilar scarring. Electronically Signed   By: Richardean Sale M.D.   On: 11/24/2021 14:31   DG Hip Unilat With Pelvis 2-3 Views Left  Result Date: 11/24/2021 CLINICAL DATA:  Left hip pain after falling at home today. EXAM: DG HIP (WITH OR WITHOUT PELVIS) 2-3V LEFT COMPARISON:  Abdominal radiograph 07/31/2020.  Pelvic CT 12/24/2016. FINDINGS: The bones are demineralized. There is a superiorly displaced fracture through the  mid left femoral neck which appears acute. No other evidence of acute fracture or dislocation. No osseous metastases are identified. There are mild degenerative changes of both hips and sacroiliac joints. The sacroiliac joints appear partially ankylosed on prior CT. Asymmetric ossification of the right ligament. Prostate brachytherapy seeds and postsurgical clips in the right inguinal region are noted. IMPRESSION: Acute, superiorly displaced fracture of the left femoral neck. Electronically Signed   By: Richardean Sale M.D.   On: 11/24/2021 14:30   CT Head Wo Contrast  Result Date: 11/24/2021 CLINICAL DATA:  Provided history: Head trauma, minor. EXAM: CT HEAD WITHOUT CONTRAST TECHNIQUE: Contiguous axial images were obtained from the base of the skull through the vertex without intravenous contrast. RADIATION DOSE REDUCTION: This exam was performed according to the departmental dose-optimization program which includes automated exposure control, adjustment  of the mA and/or kV according to patient size and/or use of iterative reconstruction technique. COMPARISON:  No pertinent prior exams available for comparison. FINDINGS: Brain: Generalized cerebral atrophy. Chronic lacunar infarcts within bilateral basal ganglia. Mild patchy and ill-defined hypoattenuation within the cerebral white matter, nonspecific but compatible with chronic small vessel ischemic disease. There is no acute intracranial hemorrhage. No demarcated cortical infarct. No extra-axial fluid collection. No evidence of an intracranial mass. No midline shift. Vascular: No hyperdense vessel. Atherosclerotic calcifications. Skull: No fracture or aggressive osseous lesion. Sinuses/Orbits: No mass or acute finding within the imaged orbits. No significant paranasal sinus disease at the imaged levels. IMPRESSION: No evidence of acute intracranial abnormality. Parenchymal atrophy and chronic small vessel ischemic disease with chronic lacunar infarcts, as  described. Electronically Signed   By: Kellie Simmering D.O.   On: 11/24/2021 14:14    Disposition Plan & Communication  Patient status: Inpatient  Admitted From: Home Planned disposition location: SNF Anticipated discharge date: 10/30 pending SNF placement  Family Communication: none at bedside     Author: Richarda Osmond, DO [facility] 11/26/2021, 7:51 AM   Available by Epic secure chat 7AM-7PM. If 7PM-7AM, please contact night-coverage.  [facility] contact information found on [email or web address].

## 2021-11-26 NOTE — Progress Notes (Addendum)
Physical Therapy Treatment Patient Details Name: Craig Ayala MRN: [id number] DOB: 1929/05/13 Today's Date: 11/26/2021   History of Present Illness Pt is a 88 yo M diagnosed with a L femoral neck fracture after a fall and is now s/p L hip hemiarthroplasty.  PMH includes: HTN, SAH, spontaneous pneumothoraxand, and prostate CA.    PT Comments    Pt was drifting off to sleep upon arriving. He is still agreeable to session but presents with very flushed appearance. RN in room and aware. Vitals were stable." My daughter (via face time) said my face looked red too." Pt remained fatigued but was agreeable to exercises to progress strength and overall improved independence. Reviewed hip precautions and importance of adhering to then. He recited 2/3 and but did follow throughout session. See exercises performed listed below. Pt also endorses having some swallowing trouble with breakfast and a little during lunch. RN to monitor during evening meal. Pt is agreeable to AM session. Author will personally return tomorrow morning as requested to advance gait and strength per current POC. 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    Recommendations for follow up therapy are one component of a multi-disciplinary discharge planning process, led by the attending physician.  Recommendations may be updated based on patient status, additional functional criteria and insurance authorization.  Follow Up Recommendations  Skilled nursing-short term rehab (<3 hours/day)     Assistance Recommended at Discharge Intermittent Supervision/Assistance  Patient can return home with the following A little help with walking and/or transfers;A little help with bathing/dressing/bathroom;Assistance with cooking/housework;Help with stairs or ramp for entrance   Equipment Recommendations  Other (comment) (TBD at SNF. Most likely will need RW and BSC)        Precautions / Restrictions Precautions Precautions: Fall;Posterior Hip Precaution Booklet Issued: Yes (comment) Restrictions Weight Bearing Restrictions: Yes LLE Weight Bearing: Weight bearing as tolerated     Mobility  Bed Mobility  General bed mobility comments: Pt was drifting off to sleep upon arrival. He becomes more alert during session howveer this session focused on improving strength. Reviewed HEP handout and had pt perform. see exercises listed below    Transfers  General transfer comment: not tested this session        Cognition Arousal/Alertness: Awake/alert Behavior During Therapy: WFL for tasks assessed/performed Overall Cognitive Status: Within Functional Limits for tasks assessed    General Comments: Pt is A and O x 4 lethargic and flush. visibly fatigued        Exercises Total Joint Exercises Ankle Circles/Pumps: AROM, Strengthening, Both, 10 reps Quad Sets: Strengthening, Both, 10 reps Gluteal Sets: Strengthening, Both, 10 reps Towel Squeeze: AROM, 10 reps Hip ABduction/ADduction: AAROM, 10 reps Straight Leg Raises: AAROM, 10 reps Other Exercises Other Exercises: Posterior hip precaution education and review during functional tasks Other Exercises: HEP education per handout    General Comments General comments (skin integrity, edema, etc.): pt did recall 2/3 hip precautions. was able to maintain and was educated on why MD gives these precautions      Pertinent Vitals/Pain Pain Assessment Pain Assessment: 0-10 Pain Score: 7  Pain Location: L hip Pain Descriptors / Indicators: Aching, Sore Pain Intervention(s): Limited activity within patient's tolerance, Monitored during session, Premedicated before session, Repositioned, Ice applied     PT Goals (current goals can now be found in the care plan section) Acute Rehab PT Goals Patient Stated Goal: improve overall mobility and strength  Frequency    BID      PT Plan Current plan  remains appropriate       AM-PAC PT "6 Clicks" Mobility   Outcome Measure  Help needed turning from your back to your side while in a flat bed without using bedrails?: A Little Help needed moving from lying on your back to sitting on the side of a flat bed without using bedrails?: A Little Help needed moving to and from a bed to a chair (including a wheelchair)?: A Little Help needed standing up from a chair using your arms (e.g., wheelchair or bedside chair)?: A Little Help needed to walk in hospital room?: A Little Help needed climbing 3-5 steps with a railing? : A Lot 6 Click Score: 17    End of Session Equipment Utilized During Treatment: Gait belt Activity Tolerance: Patient tolerated treatment well Patient left: in bed;with call bell/phone within reach;with bed alarm set;with SCD's reapplied;with nursing/sitter in room Nurse Communication: Mobility status;Weight bearing status;Precautions;Other (comment) PT Visit Diagnosis: Muscle weakness (generalized) (M62.81);Other abnormalities of gait and mobility (R26.89);Pain Pain - Right/Left: Left Pain - part of body: Hip     Time: 9326-7124 PT Time Calculation (min) (ACUTE ONLY): 24 min  Charges:  $Therapeutic Exercise: 23-37 mins                    Julaine Fusi PTA 11/26/21, 3:55 PM

## 2021-11-26 NOTE — Discharge Instructions (Signed)
Instructions after Posterior Hip Hemiarthroplasty         Dr. Serita Butcher., M.D.      Dept. of [facility]  [address] [place], [place]  [address]  Phone: [phone]   Fax: [phone]    DIET: Drink plenty of non-alcoholic fluids. Resume your normal diet. Include foods high in fiber.  ACTIVITY:  You may use crutches or a walker with weight-bearing as tolerated, unless instructed otherwise. You may be weaned off of the walker or crutches by your Physical Therapist.  Do NOT reach below the level of your knees or cross your legs until allowed.    Continue doing gentle exercises. Exercising will reduce the pain and swelling, increase motion, and prevent muscle weakness.   Please continue to use the TED compression stockings for 2 weeks. You may remove the stockings at night, but should reapply them in the morning. Do not drive or operate any equipment until instructed.  WOUND CARE:  Continue to use ice packs periodically to reduce pain and swelling. You may shower with honeycomb dressing 3 days after surgery. Do not submerge incision site under water. Remove honeycomb dressing 7 days after surgery and allow dermabond to fall off on its own.   MEDICATIONS: You may resume your regular medications. Please take the pain medication as prescribed on the medication list. Do not take pain medication on an empty stomach. You have been given a prescription for a blood thinner to prevent blood clots. Please take the medication as instructed. (NOTE: After completing a 2 week course of Lovenox, take one Enteric-coated 81 mg aspirin twice a day.) Pain medications and iron supplements can cause constipation. Use a stool softener (Senokot or Colace) on a daily basis and a laxative (dulcolax or miralax) as needed. Do not drive or drink alcoholic beverages when taking pain medications.  CALL THE OFFICE FOR: Temperature above 101  degrees Excessive bleeding or drainage on the dressing. Excessive swelling, coldness, or paleness of the toes. Persistent nausea and vomiting.  FOLLOW-UP:  You should have an appointment to return to the office in 2 weeks after surgery. Arrangements have been made for continuation of Physical Therapy (either home therapy or outpatient therapy).

## 2021-11-26 NOTE — Evaluation (Signed)
Physical Therapy Evaluation Patient Details Name: Craig Ayala MRN: [id number] DOB: 1930/01/03 Today's Date: 11/26/2021  History of Present Illness  Pt is a 88 yo M diagnosed with a L femoral neck fracture after a fall and is now s/p L hip hemiarthroplasty.  PMH includes: HTN, SAH, spontaneous pneumothoraxand, and prostate CA.   Clinical Impression  Pt was pleasant and motivated to participate during the session and put forth good effort throughout. Pt required physical assistance with bed mobility and transfers along with cuing for sequencing for hip precaution compliance.  Pt was able to amb a max of 10 feet with very effortful, antalgic, step-to gait pattern again with cues for proper sequencing.  Pt reported no adverse symptoms during the session other than LLE pain.  Pt will benefit from PT services in a SNF setting upon discharge to safely address deficits listed in patient problem list for decreased caregiver assistance and eventual return to PLOF.         Recommendations for follow up therapy are one component of a multi-disciplinary discharge planning process, led by the attending physician.  Recommendations may be updated based on patient status, additional functional criteria and insurance authorization.  Follow Up Recommendations Skilled nursing-short term rehab (<3 hours/day) Can patient physically be transported by private vehicle: Yes    Assistance Recommended at Discharge    Patient can return home with the following  A little help with walking and/or transfers;A little help with bathing/dressing/bathroom;Assistance with cooking/housework;Help with stairs or ramp for entrance    Equipment Recommendations Other (comment) (TBD at next venue of care)  Recommendations for Other Services       Functional Status Assessment Patient has had a recent decline in their functional status and demonstrates the ability to make significant improvements in function in a reasonable and  predictable amount of time.     Precautions / Restrictions Precautions Precautions: Fall;Posterior Hip Precaution Booklet Issued: Yes (comment) Restrictions Weight Bearing Restrictions: Yes LLE Weight Bearing: Weight bearing as tolerated      Mobility  Bed Mobility Overal bed mobility: Needs Assistance Bed Mobility: Sit to Supine       Sit to supine: Min assist   General bed mobility comments: Min A for LLE control and cues for sequencing    Transfers Overall transfer level: Needs assistance Equipment used: Rolling walker (2 wheels) Transfers: Sit to/from Stand Sit to Stand: Min assist           General transfer comment: Mod verbal and visual cues for sequencing for hip precaution compliance    Ambulation/Gait Ambulation/Gait assistance: Min guard Gait Distance (Feet): 10 Feet Assistive device: Rolling walker (2 wheels) Gait Pattern/deviations: Step-to pattern, Decreased stance time - left, Decreased step length - right, Antalgic Gait velocity: decreased     General Gait Details: Pt able to amb a max of 10 feet with very effortful, step-to pattern and with cues for sequencing to avoid L hip IR during sharp L turns  Stairs            Wheelchair Mobility    Modified Rankin (Stroke Patients Only)       Balance Overall balance assessment: Needs assistance Sitting-balance support: Feet unsupported, Single extremity supported Sitting balance-Leahy Scale: Good     Standing balance support: Bilateral upper extremity supported, During functional activity, Reliant on assistive device for balance Standing balance-Leahy Scale: Fair  Pertinent Vitals/Pain Pain Assessment Pain Assessment: 0-10 Pain Score: 7  Pain Location: L hip Pain Descriptors / Indicators: Aching, Sore Pain Intervention(s): Repositioned, Premedicated before session, Ice applied, Monitored during session    [place] expects  to be discharged to:: Private residence Living Arrangements: Spouse/significant other Available Help at Discharge: Family;Available PRN/intermittently Type of Home: House Home Access: Stairs to enter   [employer] of Steps: 2 steps without rails or 6 steps with bilateral rails that are too wide for both Alternate Level Stairs-Number of Steps: flight Home Layout: Two level;Bed/bath upstairs Home Equipment: Shower seat - built in Additional Comments: Pt lives with spouse who has some STM issues but may be able to provide light physical assist; pt's son and DIL are in from out of town but pt does not know how long they will be able to stay    Prior Function Prior Level of Function : Independent/Modified Independent             Mobility Comments: Ind amb community distances without an AD, no other fall history other than current fall, active, works in the yard, drives ADLs Comments: Ind with ADLs     Hand Dominance        Extremity/Trunk Assessment   Upper Extremity Assessment Upper Extremity Assessment: Overall WFL for tasks assessed    Lower Extremity Assessment Lower Extremity Assessment: Generalized weakness;LLE deficits/detail LLE: Unable to fully assess due to pain LLE Sensation: WNL LLE Coordination: WNL       Communication   Communication: No difficulties  Cognition Arousal/Alertness: Awake/alert Behavior During Therapy: WFL for tasks assessed/performed Overall Cognitive Status: Within Functional Limits for tasks assessed                                          General Comments      Exercises Total Joint Exercises Ankle Circles/Pumps: AROM, Strengthening, Both, 10 reps Quad Sets: Strengthening, Both, 10 reps Gluteal Sets: Strengthening, Both, 10 reps Long Arc Quad: AROM, Strengthening, Both, 10 reps, 15 reps Knee Flexion: AROM, Strengthening, Both, 10 reps, 15 reps Other Exercises Other Exercises: 90 deg L turn training to  prevent CKC left hip IR Other Exercises: Posterior hip precaution education and review during functional tasks Other Exercises: HEP education per handout   Assessment/Plan    PT Assessment Patient needs continued PT services  PT Problem List Decreased strength;Decreased activity tolerance;Decreased balance;Decreased mobility;Decreased knowledge of use of DME;Decreased knowledge of precautions;Pain       PT Treatment Interventions DME instruction;Gait training;Stair training;Functional mobility training;Therapeutic activities;Therapeutic exercise;Balance training;Patient/family education    PT Goals (Current goals can be found in the Care Plan section)  Acute Rehab PT Goals Patient Stated Goal: To walk with less pain PT Goal Formulation: With patient Time For Goal Achievement: 12/09/21 Potential to Achieve Goals: Good    Frequency BID     Co-evaluation               AM-PAC PT "6 Clicks" Mobility  Outcome Measure Help needed turning from your back to your side while in a flat bed without using bedrails?: A Little Help needed moving from lying on your back to sitting on the side of a flat bed without using bedrails?: A Little Help needed moving to and from a bed to a chair (including a wheelchair)?: A Little Help needed standing up from a chair using your  arms (e.g., wheelchair or bedside chair)?: A Little Help needed to walk in hospital room?: A Little Help needed climbing 3-5 steps with a railing? : A Lot 6 Click Score: 17    End of Session Equipment Utilized During Treatment: Gait belt Activity Tolerance: Patient tolerated treatment well Patient left: in bed;with call bell/phone within reach;with bed alarm set;with SCD's reapplied Nurse Communication: Mobility status;Weight bearing status;Precautions;Other (comment) (needs ice bag refilled) PT Visit Diagnosis: Muscle weakness (generalized) (M62.81);Other abnormalities of gait and mobility (R26.89);Pain Pain -  Right/Left: Left Pain - part of body: Hip    Time: 2182-8833 PT Time Calculation (min) (ACUTE ONLY): 38 min   Charges:   PT Evaluation $PT Eval Moderate Complexity: 1 Mod PT Treatments $Therapeutic Exercise: 8-22 mins $Therapeutic Activity: 8-22 mins       D. Scott Valiant Dills PT, DPT 11/26/21, 11:32 AM

## 2021-11-26 NOTE — Plan of Care (Signed)
  Problem: Education: Goal: Knowledge of General Education information will improve Description: Including pain rating scale, medication(s)/side effects and non-pharmacologic comfort measures Outcome: Progressing   Problem: Health Behavior/Discharge Planning: Goal: Ability to manage health-related needs will improve Outcome: Progressing   Problem: Clinical Measurements: Goal: Ability to maintain clinical measurements within normal limits will improve Outcome: Progressing Goal: Will remain free from infection Outcome: Progressing Goal: Diagnostic test results will improve Outcome: Progressing Goal: Respiratory complications will improve Outcome: Progressing Goal: Cardiovascular complication will be avoided Outcome: Progressing   Problem: Activity: Goal: Risk for activity intolerance will decrease Outcome: Progressing   Problem: Nutrition: Goal: Adequate nutrition will be maintained Outcome: Progressing   Problem: Coping: Goal: Level of anxiety will decrease Outcome: Progressing   Problem: Elimination: Goal: Will not experience complications related to bowel motility Outcome: Progressing Goal: Will not experience complications related to urinary retention Outcome: Progressing   Problem: Pain Managment: Goal: General experience of comfort will improve Outcome: Progressing   Problem: Safety: Goal: Ability to remain free from injury will improve Outcome: Progressing   Problem: Skin Integrity: Goal: Risk for impaired skin integrity will decrease Outcome: Progressing   Problem: Pain Management: Goal: Pain level will decrease with appropriate interventions Outcome: Progressing   Problem: Education: Goal: Verbalization of understanding the information provided (i.e., activity precautions, restrictions, etc) will improve Outcome: Progressing Goal: Individualized Educational Video(s) Outcome: Progressing   Problem: Activity: Goal: Ability to ambulate and perform  ADLs will improve Outcome: Progressing   Problem: Clinical Measurements: Goal: Postoperative complications will be avoided or minimized Outcome: Progressing   Problem: Self-Concept: Goal: Ability to maintain and perform role responsibilities to the fullest extent possible will improve Outcome: Progressing   Problem: Pain Management: Goal: Pain level will decrease Outcome: Progressing

## 2021-11-26 NOTE — Progress Notes (Addendum)
   Subjective: 1 Day Post-Op Procedure(s) (LRB): Posterior ARTHROPLASTY BIPOLAR HIP (HEMIARTHROPLASTY) (Left) Patient reports pain as mild.   Patient is well, and has had no acute complaints or problems Denies any CP, SOB, ABD pain. We will start physical therapy today.   Objective: Vital signs in last 24 hours: Temp:  [97.2 F (36.2 C)-98.3 F (36.8 C)] 98.1 F (36.7 C) (10/27 0436) Pulse Rate:  [64-87] 74 (10/27 0436) Resp:  [9-22] 17 (10/26 2340) BP: (120-160)/(58-83) 141/83 (10/27 0436) SpO2:  [89 %-97 %] 95 % (10/27 0436) Weight:  [99.8 kg] 99.8 kg (10/26 1210)  Intake/Output from previous day: 10/26 0701 - 10/27 0700 In: 0998 [I.V.:1548; IV Piggyback:200] Out: 1400 [Urine:1100; Blood:300] Intake/Output this shift: No intake/output data recorded.  Recent Labs    11/24/21 1247 11/25/21 0437 11/26/21 0634  HGB 13.9 12.7* 12.1*   Recent Labs    11/25/21 0437 11/26/21 0634  WBC 6.1 10.9*  RBC 3.98* 3.81*  HCT 36.9* 35.0*  PLT 144* 143*   Recent Labs    11/25/21 0437 11/26/21 0634  NA 141 138  K 3.9 4.1  CL 107 106  CO2 27 24  BUN 20 21  CREATININE 1.30* 1.37*  GLUCOSE 110* 214*  CALCIUM 8.5* 7.9*   Recent Labs    11/24/21 1247  INR 1.1    EXAM General - Patient is Alert, Appropriate, and Oriented Extremity - Neurovascular intact Sensation intact distally Intact pulses distally Dorsiflexion/Plantar flexion intact No cellulitis present Compartment soft Dressing - dressing C/D/I and no drainage Motor Function - intact, moving foot and toes well on exam.   Past Medical History:  Diagnosis Date   Aneurysmal subarachnoid hemorrhage (HCC)    Cancer (HCC)    squamous cell   Gout    Kidney stone    Peptic ulcer    Pilonidal cyst    Spontaneous pneumothorax     Assessment/Plan:   1 Day Post-Op Procedure(s) (LRB): Posterior ARTHROPLASTY BIPOLAR HIP (HEMIARTHROPLASTY) (Left) Principal Problem:   Hip fracture (HCC) Active Problems:    Essential hypertension   Fall  Estimated body mass index is 29.84 kg/m as calculated from the following:   Height as of this encounter: 6' (1.829 m).   Weight as of this encounter: 99.8 kg. Advance diet Up with therapy Pain well controlled Vital signs are stable  Labs stable, hemoglobin 12.1 Care management to assist with discharge based on PT recommendation.   Patient will need follow-up with [facility] orthopedics in 2 weeks Lovenox 40 mg subcu daily x14 days at discharge TED hose bilateral lower extremity x2 weeks Patient  may shower with honeycomb dressing 3 days after surgery. Do not submerge incision site under water. Remove honeycomb dressing 7 days after surgery and allow dermabond to fall off on its own  DVT Prophylaxis - Lovenox, TED hose, and SCDs Weight-Bearing as tolerated to left leg   T. Rachelle Hora, PA-C [place] 11/26/2021, 8:14 AM  Patient seen and examined, agree with above plan.  The patient is doing well status post Left hip hemiarthroplasty, no concerns at this time.  Pain is controlled.  Discussed DVT prophylaxis, pain medication use, and safe transition to home or rehab.  All questions answered   Steffanie Rainwater

## 2021-11-27 DIAGNOSIS — I10 Essential (primary) hypertension: Secondary | ICD-10-CM | POA: Diagnosis not present

## 2021-11-27 DIAGNOSIS — S72002A Fracture of unspecified part of neck of left femur, initial encounter for closed fracture: Secondary | ICD-10-CM | POA: Diagnosis not present

## 2021-11-27 DIAGNOSIS — W19XXXA Unspecified fall, initial encounter: Secondary | ICD-10-CM | POA: Diagnosis not present

## 2021-11-27 LAB — BASIC METABOLIC PANEL
Anion gap: 4 — ABNORMAL LOW (ref 5–15)
BUN: 24 mg/dL — ABNORMAL HIGH (ref 8–23)
CO2: 28 mmol/L (ref 22–32)
Calcium: 7.9 mg/dL — ABNORMAL LOW (ref 8.9–10.3)
Chloride: 109 mmol/L (ref 98–111)
Creatinine, Ser: 1.38 mg/dL — ABNORMAL HIGH (ref 0.61–1.24)
GFR, Estimated: 49 mL/min — ABNORMAL LOW (ref >60)
Glucose, Bld: 134 mg/dL — ABNORMAL HIGH (ref 70–99)
Potassium: 3.7 mmol/L (ref 3.5–5.1)
Sodium: 141 mmol/L (ref 135–145)

## 2021-11-27 LAB — CBC
HCT: 33.6 % — ABNORMAL LOW (ref 39.0–52.0)
Hemoglobin: 11.5 g/dL — ABNORMAL LOW (ref 13.0–17.0)
MCH: 31.9 pg (ref 26.0–34.0)
MCHC: 34.2 g/dL (ref 30.0–36.0)
MCV: 93.1 fL (ref 80.0–100.0)
Platelets: 145 10*3/uL — ABNORMAL LOW (ref 150–400)
RBC: 3.61 MIL/uL — ABNORMAL LOW (ref 4.22–5.81)
RDW: 13.9 % (ref 11.5–15.5)
WBC: 7.6 10*3/uL (ref 4.0–10.5)
nRBC: 0 % (ref 0.0–0.2)

## 2021-11-27 MED ORDER — ENOXAPARIN SODIUM 40 MG/0.4ML IJ SOSY: 40.0000 mg | PREFILLED_SYRINGE | INTRAMUSCULAR | 0 refills | Status: DC

## 2021-11-27 MED ORDER — LACTULOSE 10 GM/15ML PO SOLN: 20.0000 g | Freq: Two times a day (BID) | ORAL | Status: DC | PRN

## 2021-11-27 MED ORDER — ACETAMINOPHEN 500 MG PO TABS: 1000.0000 mg | ORAL_TABLET | Freq: Three times a day (TID) | ORAL | 0 refills | Status: AC

## 2021-11-27 MED ORDER — TRAMADOL HCL 50 MG PO TABS: 50.0000 mg | ORAL_TABLET | Freq: Four times a day (QID) | ORAL | 0 refills | Status: DC | PRN

## 2021-11-27 NOTE — Discharge Summary (Signed)
Physician Discharge Summary  Patient: Craig Ayala GDJ:[id number] DOB: July 18, 1929   Code Status: Full Code Admit date: 11/24/2021 Discharge date: 11/27/2021 Disposition: Home health, PT and OT PCP: Idelle Crouch, MD  Recommendations for Outpatient Follow-up:  Follow up with ortho in 2 weeks  Discharge Diagnoses:  Principal Problem:   Hip fracture [facility]) Active Problems:   Essential hypertension   [facility] Hospital Course Summary: Craig Ayala is a 88 y.o. male with a PMH significant for HTN.   They presented from home to the ED on 11/24/2021 with fall from a step which resulted in a L femoral neck fracture.   In the ED, it was found that they had stable vital signs with the exception of his chronic HTN to 160s/90s.   They were initially treated with pain medications and ortho surgery was consulted.   He underwent fixation 10/27. He tolerated the procedure well and progressed with PT post op with minimal pain. They recommended home health PT for follow up rehab.   All other chronic conditions were treated with home medications.   Discharge Condition: Good, improved Recommended discharge diet: Regular healthy diet  Consultations: Ortho surgery  Procedures/Studies: Fixation of L femoral neck fracture  Allergies as of 11/27/2021   No Known Allergies      Medication List     TAKE these medications    acetaminophen 500 MG tablet Commonly known as: TYLENOL Take 2 tablets (1,000 mg total) by mouth 3 (three) times daily.   aspirin EC 81 MG tablet Take 81 mg by mouth daily.   bisoprolol-hydrochlorothiazide 5-6.25 MG tablet Commonly known as: ZIAC Take 1 tablet by mouth daily.   D3 ADULT PO Take 1 tablet by mouth daily.   doxazosin 2 MG tablet Commonly known as: CARDURA Take 2 mg by mouth every evening.   Ferrous Sulfate 142 (45 Fe) MG Tbcr Take 45 mg by mouth daily.   Fish Oil 1200 MG Caps Take 1,200 mg by mouth 2 (two) times daily.    latanoprost 0.005 % ophthalmic solution Commonly known as: XALATAN Place 1 drop into both eyes at bedtime.   omeprazole 20 MG capsule Commonly known as: PRILOSEC Take 20 mg by mouth daily.   tiZANidine 2 MG tablet Commonly known as: ZANAFLEX Take 2 mg by mouth at bedtime as needed for muscle spasms.               Durable Medical Equipment  (From admission, onward)           Start     Ordered   11/27/21 1252  For home use only DME Bedside commode  Once       Question:  Patient needs a bedside commode to treat with the following condition  Answer:  Physical deconditioning   11/27/21 1252   11/27/21 1252  For home use only DME Walker rolling  Once       Question Answer Comment  Walker: With [place]   Patient needs a walker to treat with the following condition Hip fracture ([place])      11/27/21 1252            Follow-up Information     Duanne Guess, PA-C Follow up in 2 week(s).   Specialties: Orthopedic Surgery, Emergency Medicine Contact information: [place] [address] [phone]                 Subjective   Pt reports  minimal pain only with weight bearing. None at rest. He feels ready to go home.  Objective  Blood pressure (!) 141/76, pulse 78, temperature 97.9 F (36.6 C), resp. rate 16, height 6' (1.829 m), weight 99.8 kg, SpO2 91 %.   General: Pt is alert, awake, not in acute distress Cardiovascular: RRR, S1/S2 +, no rubs, no gallops Respiratory: CTA bilaterally, no wheezing, no rhonchi Abdominal: Soft, NT, ND, bowel sounds + Extremities: no edema, no cyanosis. Mild tenderness to palpation around incision. No obvious bleeding or hematoma.   The results of significant diagnostics from this hospitalization (including imaging, microbiology, ancillary and laboratory) are listed below for reference.   Imaging studies: DG Pelvis Portable  Result Date: 11/25/2021 CLINICAL DATA:  Hip replacement EXAM: PORTABLE  PELVIS 1-2 VIEWS COMPARISON:  11/24/2021 FINDINGS: Interval resection of LEFT femoral head and fractured femoral neck with placement of a LEFT femoral prosthesis. Diffuse osseous demineralization. No fracture or dislocation. Seed implants prostate bed. Remaining osseous structures unremarkable. IMPRESSION: LEFT hip hemiarthroplasty without acute osseous abnormalities. Electronically Signed   By: Lavonia Dana M.D.   On: 11/25/2021 16:06   DG FEMUR MIN 2 VIEWS LEFT  Result Date: 11/24/2021 CLINICAL DATA:  Hip fracture. EXAM: LEFT FEMUR 2 VIEWS COMPARISON:  Pelvis and left hip radiographs 11/24/2021 FINDINGS: Redemonstration of vertical oriented fracture of the left femoral neck with mild superior displacement of the distal fracture, with respect to the proximal fracture component, unchanged. Mild left femoroacetabular osteoarthritis. Mild-to-moderate partially visualized left knee osteoarthritis. Mild chronic enthesopathic change at the quadriceps insertion on the patella. Brachytherapy seeds are again seen. IMPRESSION: Redemonstration of vertically oriented fracture of the left femoral neck. No additional fracture is seen within the more distal left femur. Electronically Signed   By: Yvonne Kendall M.D.   On: 11/24/2021 16:05   DG Chest 1 View  Result Date: 11/24/2021 CLINICAL DATA:  Fall at home today.  Left hip fracture. EXAM: CHEST  1 VIEW COMPARISON:  Radiographs 12/24/2016. FINDINGS: 1404 hours. Mild patient rotation to the right. Allowing for this, the heart size and mediastinal contours are stable. There is mild chronic scarring at the left lung base. No confluent airspace opacity, pleural effusion or pneumothorax identified. Old left-sided rib fractures are noted with adjacent pleural thickening. No acute fractures are seen within the visualized chest. Telemetry leads overlie the chest. IMPRESSION: No evidence of acute chest injury. Old left-sided rib fractures and left basilar scarring.  Electronically Signed   By: Richardean Sale M.D.   On: 11/24/2021 14:31   DG Hip Unilat With Pelvis 2-3 Views Left  Result Date: 11/24/2021 CLINICAL DATA:  Left hip pain after falling at home today. EXAM: DG HIP (WITH OR WITHOUT PELVIS) 2-3V LEFT COMPARISON:  Abdominal radiograph 07/31/2020.  Pelvic CT 12/24/2016. FINDINGS: The bones are demineralized. There is a superiorly displaced fracture through the mid left femoral neck which appears acute. No other evidence of acute fracture or dislocation. No osseous metastases are identified. There are mild degenerative changes of both hips and sacroiliac joints. The sacroiliac joints appear partially ankylosed on prior CT. Asymmetric ossification of the right ligament. Prostate brachytherapy seeds and postsurgical clips in the right inguinal region are noted. IMPRESSION: Acute, superiorly displaced fracture of the left femoral neck. Electronically Signed   By: Richardean Sale M.D.   On: 11/24/2021 14:30   CT Head Wo Contrast  Result Date: 11/24/2021 CLINICAL DATA:  Provided history: Head trauma, minor. EXAM: CT HEAD WITHOUT CONTRAST TECHNIQUE: Contiguous axial images  were obtained from the base of the skull through the vertex without intravenous contrast. RADIATION DOSE REDUCTION: This exam was performed according to the departmental dose-optimization program which includes automated exposure control, adjustment of the mA and/or kV according to patient size and/or use of iterative reconstruction technique. COMPARISON:  No pertinent prior exams available for comparison. FINDINGS: Brain: Generalized cerebral atrophy. Chronic lacunar infarcts within bilateral basal ganglia. Mild patchy and ill-defined hypoattenuation within the cerebral white matter, nonspecific but compatible with chronic small vessel ischemic disease. There is no acute intracranial hemorrhage. No demarcated cortical infarct. No extra-axial fluid collection. No evidence of an intracranial mass. No  midline shift. Vascular: No hyperdense vessel. Atherosclerotic calcifications. Skull: No fracture or aggressive osseous lesion. Sinuses/Orbits: No mass or acute finding within the imaged orbits. No significant paranasal sinus disease at the imaged levels. IMPRESSION: No evidence of acute intracranial abnormality. Parenchymal atrophy and chronic small vessel ischemic disease with chronic lacunar infarcts, as described. Electronically Signed   By: Kellie Simmering D.O.   On: 11/24/2021 14:14    Labs: Basic Metabolic Panel: Recent Labs  Lab 11/24/21 1247 11/25/21 0437 11/26/21 0634 11/27/21 0516  NA 141 141 138 141  K 4.5 3.9 4.1 3.7  CL 107 107 106 109  CO2 '27 27 24 28  '$ GLUCOSE 142* 110* 214* 134*  BUN '20 20 21 '$ 24*  CREATININE 1.21 1.30* 1.37* 1.38*  CALCIUM 8.9 8.5* 7.9* 7.9*   CBC: Recent Labs  Lab 11/24/21 1247 11/25/21 0437 11/26/21 0634 11/27/21 0516  WBC 5.5 6.1 10.9* 7.6  NEUTROABS 3.3  --   --   --   HGB 13.9 12.7* 12.1* 11.5*  HCT 40.6 36.9* 35.0* 33.6*  MCV 94.2 92.7 91.9 93.1  PLT 153 144* 143* 145*   Microbiology: Results for orders placed or performed during the hospital encounter of 11/24/21  Surgical PCR screen     Status: None   Collection Time: 11/24/21 10:47 PM   Specimen: Nasal Mucosa; Nasal Swab  Result Value Ref Range Status   MRSA, PCR NEGATIVE NEGATIVE Final   Staphylococcus aureus NEGATIVE NEGATIVE Final    Comment: (NOTE) The Xpert SA Assay (FDA approved for NASAL specimens in patients 30 years of age and older), is one component of a comprehensive surveillance program. It is not intended to diagnose infection nor to guide or monitor treatment. Performed at [facility], [address] [place], [place] [address]     Time coordinating discharge: Over 30 minutes  Richarda Osmond, MD  [facility] 11/27/2021, 2:21 PM

## 2021-11-27 NOTE — Progress Notes (Signed)
Physical Therapy Treatment Patient Details Name: Craig Ayala MRN: [id number] DOB: [birth year] Today's Date: 11/27/2021   History of Present Illness Pt is a 88 yo M diagnosed with a L femoral neck fracture after a fall and is now s/p L hip hemiarthroplasty.  PMH includes: HTN, SAH, spontaneous pneumothoraxand, and prostate CA.    PT Comments    Pt was sitting in recliner upon arrival. He remains A and O x 4. Able to correctly state 3/3 hip precautions. Pt was pushed in recliner to practice stairs. Safely performed stairs with step to pattern without use of rails(simulate home entry). Did use RW however no physical  lifting assistance required. CGA only for safety. Discussed with pt/pt's son Dc planning. They are confident and comfortable with [facility] home when cleared medically. Issued gait belt for additional safety. Pt is still awaiting RW/BSC to be delivered. Overall pt is progressing well. Will continue to benefit from HHPT at DC to maximize his independence while decreasing crgiver burden.     Recommendations for follow up therapy are one component of a multi-disciplinary discharge planning process, led by the attending physician.  Recommendations may be updated based on patient status, additional functional criteria and insurance authorization.  Follow Up Recommendations  Home health PT     Assistance Recommended at Discharge Intermittent Supervision/Assistance  Patient can return home with the following A little help with walking and/or transfers;A little help with bathing/dressing/bathroom;Assistance with cooking/housework;Help with stairs or ramp for entrance   Equipment Recommendations  Rolling walker (2 wheels);BSC/3in1 (pt is awaiting BSC/RW to be delivered)       Precautions / Restrictions Precautions Precautions: Fall;Posterior Hip Precaution Booklet Issued: Yes (comment) Restrictions Weight Bearing Restrictions: Yes LLE Weight Bearing: Weight bearing as tolerated      Mobility  Bed Mobility Overal bed mobility: Needs Assistance Bed Mobility: Sit to Supine     Supine to sit: Min assist Sit to supine: Min assist, Mod assist   General bed mobility comments: assisted LLE into bed. Did explain to son that he will need to assist pt with this at DC." I can do that."    Transfers Overall transfer level: Needs assistance Equipment used: Rolling walker (2 wheels) Transfers: Sit to/from Stand Sit to Stand: Supervision      General transfer comment: Increased time to perform however pt was able to stand form recliner to RW without physical assistance or Vcs. Demonstrated safe ability to perform without vcs while adhering to hip precautions    Ambulation/Gait Ambulation/Gait assistance: Min guard Gait Distance (Feet): 100 Feet Assistive device: Rolling walker (2 wheels) Gait Pattern/deviations: Step-through pattern, Antalgic Gait velocity: decreased     General Gait Details: no LOB with ambulation with RW   Stairs Stairs: Yes Stairs assistance: Min guard Stair Management: No rails, Step to pattern, Backwards, With walker Number of Stairs: 2 General stair comments: pt performed ascending/descending 2 stair 2 x with RW + CGA. Son was educated on phone for proper performance and assistance he will require. Gait belt issued for additional safety. Pt and son voice confidence in abilities to safely perfrom when cleared for DC. Also discuss car transfers with pt able to state with teach back technique.    Balance Overall balance assessment: Needs assistance Sitting-balance support: Feet supported Sitting balance-Leahy Scale: Good     Standing balance support: During functional activity, Reliant on assistive device for balance, Bilateral upper extremity supported Standing balance-Leahy Scale: Fair Standing balance comment: reliant on RW for dynamic balance however  pt was safe with gait with use of AD. No LOB noted. RW ordered for home use        Cognition Arousal/Alertness: Awake/alert Behavior During Therapy: WFL for tasks assessed/performed Overall Cognitive Status: Within Functional Limits for tasks assessed        General Comments: Pt is A and O x 4. Was able to recall all three precautions this afternoon        Exercises Other Exercises Other Exercises: Posterior hip precaution education and review during functional tasks    General Comments General comments (skin integrity, edema, etc.): 3/3 precautions this afternoon. Issued gait belt for additional safety.      Pertinent Vitals/Pain Pain Assessment Pain Assessment: No/denies pain Pain Score: 0-No pain Pain Location: L hip Pain Descriptors / Indicators: Aching, Sore Pain Intervention(s): Limited activity within patient's tolerance, Monitored during session, Premedicated before session, Repositioned, Ice applied     PT Goals (current goals can now be found in the care plan section) Acute Rehab PT Goals Patient Stated Goal: "I want to go home, not rehab." Progress towards PT goals: Progressing toward goals    Frequency    BID      PT Plan Discharge plan needs to be updated       AM-PAC PT "6 Clicks" Mobility   Outcome Measure  Help needed turning from your back to your side while in a flat bed without using bedrails?: A Little Help needed moving from lying on your back to sitting on the side of a flat bed without using bedrails?: A Lot Help needed moving to and from a bed to a chair (including a wheelchair)?: A Little Help needed standing up from a chair using your arms (e.g., wheelchair or bedside chair)?: A Little Help needed to walk in hospital room?: A Little Help needed climbing 3-5 steps with a railing? : A Little 6 Click Score: 17    End of Session Equipment Utilized During Treatment: Gait belt Activity Tolerance: Patient tolerated treatment well Patient left: in bed;with call bell/phone within reach;with bed alarm set Nurse  Communication: Mobility status PT Visit Diagnosis: Muscle weakness (generalized) (M62.81);Other abnormalities of gait and mobility (R26.89);Pain Pain - Right/Left: Left Pain - part of body: Hip     Time: 1325-1350 PT Time Calculation (min) (ACUTE ONLY): 25 min  Charges:  $Gait Training: 23-37 mins $Therapeutic Activity: 8-22 mins                    Julaine Fusi PTA 11/27/21, 2:34 PM

## 2021-11-27 NOTE — Progress Notes (Signed)
Subjective: 2 Days Post-Op Procedure(s) (LRB): Posterior ARTHROPLASTY BIPOLAR HIP (HEMIARTHROPLASTY) (Left) Patient reports pain as mild in bed this morning.  Increased pain with therapy. Patient is well, and has had no acute complaints or problems Denies any CP, SOB, ABD pain. Patient is passing gas but has not had a BM yet. Therapy currently recommending SNF.  Objective: Vital signs in last 24 hours: Temp:  [97.6 F (36.4 C)-98.4 F (36.9 C)] 98.4 F (36.9 C) (10/27 2334) Pulse Rate:  [73-84] 84 (10/27 2334) Resp:  [18] 18 (10/27 2334) BP: (117-149)/(57-68) 122/59 (10/27 2334) SpO2:  [94 %] 94 % (10/27 2334)  Intake/Output from previous day: 10/27 0701 - 10/28 0700 In: -  Out: 1200 [Urine:1200] Intake/Output this shift: No intake/output data recorded.  Recent Labs    11/24/21 1247 11/25/21 0437 11/26/21 0634 11/27/21 0516  HGB 13.9 12.7* 12.1* 11.5*   Recent Labs    11/26/21 0634 11/27/21 0516  WBC 10.9* 7.6  RBC 3.81* 3.61*  HCT 35.0* 33.6*  PLT 143* 145*   Recent Labs    11/26/21 0634 11/27/21 0516  NA 138 141  K 4.1 3.7  CL 106 109  CO2 24 28  BUN 21 24*  CREATININE 1.37* 1.38*  GLUCOSE 214* 134*  CALCIUM 7.9* 7.9*   Recent Labs    11/24/21 1247  INR 1.1    EXAM General - Patient is Alert, Appropriate, and Oriented Extremity - Neurovascular intact Sensation intact distally Intact pulses distally Dorsiflexion/Plantar flexion intact No cellulitis present Compartment soft Dressing - dressing C/D/I and no drainage Motor Function - intact, moving foot and toes well on exam.  Abdomen soft with intact bowel sounds this morning.  Past Medical History:  Diagnosis Date   Aneurysmal subarachnoid hemorrhage (HCC)    Cancer (HCC)    squamous cell   Gout    Kidney stone    Peptic ulcer    Pilonidal cyst    Spontaneous pneumothorax     Assessment/Plan:   2 Days Post-Op Procedure(s) (LRB): Posterior ARTHROPLASTY BIPOLAR HIP  (HEMIARTHROPLASTY) (Left) Principal Problem:   Hip fracture (HCC) Active Problems:   Essential hypertension   Fall  Estimated body mass index is 29.84 kg/m as calculated from the following:   Height as of this encounter: 6' (1.829 m).   Weight as of this encounter: 99.8 kg. Advance diet Up with therapy Pain well controlled Vital signs are stable  Labs stable, hemoglobin 11.5 Care management to assist with discharge based on PT recommendation, current plan is for possible d/c to SNF. Patient is passing gas, no BM yet.  Lactulose added to regimen.  Patient will need follow-up with [facility] orthopedics in 2 weeks. Lovenox 40 mg subcu daily x14 days at discharge TED hose bilateral lower extremity x2 weeks Patient  may shower with honeycomb dressing 3 days after surgery. Do not submerge incision site under water. Remove honeycomb dressing 7 days after surgery and allow dermabond to fall off on its own  DVT Prophylaxis - Lovenox, TED hose, and SCDs Weight-Bearing as tolerated to left leg  J. Cameron Proud, PA-C [place] 11/27/2021, 7:25 AM

## 2021-11-27 NOTE — Progress Notes (Signed)
Occupational Therapy Treatment Patient Details Name: Craig Ayala MRN: [id number] DOB: [birth year] Today's Date: 11/27/2021   History of present illness Pt is a 88 yo M diagnosed with a L femoral neck fracture after a fall and is now s/p L hip hemiarthroplasty.  PMH includes: HTN, SAH, spontaneous pneumothoraxand, and prostate CA.   OT comments  Upon entering session, pt resting in bed and agreeable to OT. Tx session focused on education and demonstration of LB dressing AE including reacher, sock aid, and shoe horn. Pt demonstrated good carryover of education provided by OT. Able to complete LB dressing using AE with set up-supervision while seated and VC for compensatory technique. Pt required Min guard for hiking underwear in standing. Based on pt's progress, he would benefit from [facility] upon discharge to decrease falls risk, improve balance, and maximize independence in self-care within own home environment. Pt will need BSC and hip kit for safe D/C home.     Recommendations for follow up therapy are one component of a multi-disciplinary discharge planning process, led by the attending physician.  Recommendations may be updated based on patient status, additional functional criteria and insurance authorization.    Follow Up Recommendations  Home health OT    Assistance Recommended at Discharge Intermittent Supervision/Assistance  Patient can return home with the following  A little help with walking and/or transfers;Help with stairs or ramp for entrance;A little help with bathing/dressing/bathroom;Assistance with cooking/housework;Assist for transportation   Equipment Recommendations  BSC/3in1;Other (comment) (hip kit-showed pt how to order online)    Recommendations for Other Services      Precautions / Restrictions Precautions Precautions: Fall;Posterior Hip Precaution Booklet Issued: Yes (comment) Restrictions Weight Bearing Restrictions: Yes LLE Weight Bearing: Weight bearing as  tolerated       Mobility Bed Mobility Overal bed mobility: Needs Assistance Bed Mobility: Supine to Sit     Supine to sit: Mod assist     General bed mobility comments: assist for guiding LLE off of bed and trunk elevation    Transfers Overall transfer level: Needs assistance Equipment used: Rolling walker (2 wheels), None Transfers: Sit to/from Stand Sit to Stand: From elevated surface, Supervision, Min guard           General transfer comment: Unable to come to fully upright standing position on 1st trial from EOB requiring seated rest break before trying again, completed 2nd trial with supervision. VC to "kick out" LLE before standing/sitting. STS from recliner with Min guard and no AD (use of arm rests).     Balance Overall balance assessment: Needs assistance Sitting-balance support: Feet supported Sitting balance-Leahy Scale: Good     Standing balance support: During functional activity, Reliant on assistive device for balance, Bilateral upper extremity supported Standing balance-Leahy Scale: Fair                             ADL either performed or assessed with clinical judgement   ADL Overall ADL's : Needs assistance/impaired                     Lower Body Dressing: With adaptive equipment;Sitting/lateral leans;Sit to/from stand;Set up;Supervision/safety;Cueing for compensatory techniques;Min guard Lower Body Dressing Details (indicate cue type and reason): Doffed socks with reacher, donned socks with sock aid, and donned underwear with reacher in sitting with set up-supervision and VC for compensatory technique. Min guard to hike underwear over B hips in standing.  Functional mobility during ADLs: Min guard;Rolling walker (2 wheels) (to take several steps toward recliner)      Extremity/Trunk Assessment Upper Extremity Assessment Upper Extremity Assessment: Overall WFL for tasks assessed   Lower Extremity  Assessment Lower Extremity Assessment: Generalized weakness        Vision Baseline Vision/History: 1 Wears glasses Patient Visual Report: No change from baseline     Perception     Praxis      Cognition Arousal/Alertness: Awake/alert Behavior During Therapy: WFL for tasks assessed/performed Overall Cognitive Status: Within Functional Limits for tasks assessed                                          Exercises Other Exercises Other Exercises: OT provided education re: posterior hip precautions; safe transfer techniques; "first in, last out" compensatory dressing technique for surgical side; LB dressing AE (reacher, sock aid, shoe horn) -showed pt how to order hip kit online    Shoulder Instructions       General Comments     Pertinent Vitals/ Pain       Pain Assessment Pain Assessment: No/denies pain  Home Living                                          Prior Functioning/Environment              Frequency  Min 2X/week        Progress Toward Goals  OT Goals(current goals can now be found in the care plan section)  Progress towards OT goals: Progressing toward goals  Acute Rehab OT Goals Patient Stated Goal: to go home OT Goal Formulation: With patient Time For Goal Achievement: 12/10/21 Potential to Achieve Goals: Good   Plan Discharge plan needs to be updated    Co-evaluation                 AM-PAC OT "6 Clicks" Daily Activity     Outcome Measure   Help from another person eating meals?: None Help from another person taking care of personal grooming?: A Little Help from another person toileting, which includes using toliet, bedpan, or urinal?: A Little Help from another person bathing (including washing, rinsing, drying)?: A Lot Help from another person to put on and taking off regular upper body clothing?: A Little Help from another person to put on and taking off regular lower body clothing?: A  Little 6 Click Score: 18    End of Session Equipment Utilized During Treatment: Gait belt;Rolling walker (2 wheels)  OT Visit Diagnosis: Other abnormalities of gait and mobility (R26.89);Muscle weakness (generalized) (M62.81)   Activity Tolerance Patient tolerated treatment well   Patient Left in chair;with call bell/phone within reach;with chair alarm set   Nurse Communication Mobility status        Time: 1191-4782 OT Time Calculation (min): 27 min  Charges: OT General Charges $OT Visit: 1 Visit OT Treatments $Self Care/Home Management : 23-37 mins  [facility] MS, OTR/L ascom [phone]  11/27/21, 3:15 PM

## 2021-11-27 NOTE — Progress Notes (Signed)
Patient is not able to walk the distance required to go the bathroom, or he/she is unable to safely negotiate stairs required to access the bathroom.  A 3in1 BSC will alleviate this problem  

## 2021-11-27 NOTE — TOC Initial Note (Addendum)
Transition of Care [facility]) - Initial/Assessment Note    Patient Details  Name: Craig Ayala MRN: [id number] Date of Birth: 05-10-1929  Transition of [facility]) CM/SW Contact:    Magnus Ivan, LCSW Phone Number: 11/27/2021, 10:44 AM  Clinical Narrative:                 CSW spoke with patient regarding PT recs for SNF. Patient states he does not want to go to SNF, feels comfortable going home with [facility]. No agency preference.  Patient states he lives with his wife who needs someone with her for supervision. Patient states his son is staying with him for a while to help when he gets discharged.  Patient states his PCP is Dr. Doy Hutching. [facility]acy [facility]. Patient drives himself to appts at baseline. Patient is agreeable to DME being ordered, asked PT for recs.  HH referral accepted by Corene Cornea with [facility].   12:35- Per PT, patient needs a RW and 3in1/BSC. Referral made to [facility] with [employer] for DME to be delivered to bedside prior to DC today. Care Team notified.    Expected Discharge Plan: [place] Barriers to Discharge: Continued Medical Work up   Patient Goals and CMS Choice Patient states their goals for this hospitalization and ongoing recovery are:: home with home health CMS Medicare.gov Compare Post Acute Care list provided to:: Patient Choice offered to / list presented to : Patient  Expected Discharge Plan and Services Expected Discharge Plan: [place]       Living arrangements for the past 2 months: [place] Arranged: PT, OT [place] Agency: [place] ([place]) Date HH Agency Contacted: 11/27/21   Representative spoke with at [place]: Corene Cornea  Prior Living Arrangements/Services Living arrangements for the past 2 months: [place] Lives with:: Spouse Patient language and need for interpreter reviewed:: Yes Do you feel safe going back to the place where  you live?: Yes      Need for Family Participation in Patient Care: Yes (Comment) Care giver support system in place?: Yes (comment)   Criminal Activity/Legal Involvement Pertinent to Current Situation/Hospitalization: No - Comment as needed  Activities of Daily Living Home Assistive Devices/Equipment: Eyeglasses ADL Screening (condition at time of admission) Patient's cognitive ability adequate to safely complete daily activities?: Yes Is the patient deaf or have difficulty hearing?: No Does the patient have difficulty seeing, even when wearing glasses/contacts?: No Does the patient have difficulty concentrating, remembering, or making decisions?: No Patient able to express need for assistance with ADLs?: Yes Does the patient have difficulty dressing or bathing?: Yes Independently performs ADLs?: No Communication: Independent Dressing (OT): Needs assistance Is this a change from baseline?: Change from baseline, expected to last >3 days Grooming: Independent Feeding: Independent Bathing: Needs assistance Is this a change from baseline?: Change from baseline, expected to last >3 days Toileting: Needs assistance Is this a change from baseline?: Change from baseline, expected to last >3days In/Out Bed: Needs assistance Is this a change from baseline?: Change from baseline, expected to last >3 days Walks in Home: Needs assistance Is this a change from baseline?: Change from baseline, expected to last >3 days Does the patient have difficulty walking or climbing stairs?: Yes Weakness of Legs: Left Weakness of Arms/Hands: None  Permission  Sought/Granted Permission sought to share information with : Facility Sport and exercise psychologist, Family Supports Permission granted to share information with : Yes, Verbal Permission Granted     Permission granted to share info w AGENCY: [place] and DME agencies        Emotional Assessment       Orientation: : Oriented to Self, Oriented to Place,  Oriented to  Time, Oriented to Situation Alcohol / Substance Use: Not Applicable Psych Involvement: No (comment)  Admission diagnosis:  Hip fracture ([place]) [S72.009A] Closed fracture of left hip, initial encounter ([place]) [S72.002A] Fall, initial encounter [W19.XXXA] Patient Active Problem List   Diagnosis Date Noted   Fall    Hip fracture ([place]) 11/24/2021   Gastric ulcer 01/03/2017   Gout 01/03/2017   Hyperlipidemia, unspecified 01/03/2017   Aneurysm ([place]) 01/03/2017   Essential hypertension 01/03/2017   Lung disease 01/03/2017   Nephrolithiasis 01/03/2017   Prostate cancer ([place]) 01/03/2017   Cholecystitis, acute 12/24/2016   Complete tear of left rotator cuff 07/03/2014   PCP:  Idelle Crouch, MD [id number] [place] [address]Phone: [phone]: [phone]    Social Determinants of Health (SDOH) Interventions    Readmission Risk Interventions     No data to display

## 2021-11-27 NOTE — Progress Notes (Signed)
Physical Therapy Treatment Patient Details Name: Craig Ayala MRN: [id number] DOB: 10-08-1929 Today's Date: 11/27/2021   History of Present Illness Pt is a 88 yo M diagnosed with a L femoral neck fracture after a fall and is now s/p L hip hemiarthroplasty.  PMH includes: HTN, SAH, spontaneous pneumothoraxand, and prostate CA.    PT Comments    Pt was long sitting in bed upon arriving. Agrees to session and is cooperative and pleasant throughout. Is A and O x 4 but only able to recall 1 of 3 hip precautions. Re-educated on hip precautions with pt then able to recite 3/3 and adhere to them throughout the remainder of session.He endorses no pain at rest but 7/10 pain with movements and wt bearing. Was not limited by pain but was by fatigue. HR and sao2 were stable throughout. Pt only required physical assistance to safely exit bed. He was able to stand and ambulate with CGA only for safety. No LOB or unsteadiness during gait ~ 33f with RW.Overall progressing quickly. Discussed DC home versus DC to rehab. " I want to DC home if I am safe to do so." Dicussed surgeons preferred criteria of clearance for safe DC home (Ambulate full lap in hallway and perform stairs). Author will  have pt perform both this afternoon. Do feel pt can progress over next few PT session to safely DC home with HHPT. Pt's supportive son is available to provide 24/7 care at DC. Will need RW and BSC if DDoolyhome.       Recommendations for follow up therapy are one component of a multi-disciplinary discharge planning process, led by the attending physician.  Recommendations may be updated based on patient status, additional functional criteria and insurance authorization.  Follow Up Recommendations  Skilled nursing-short term rehab (<3 hours/day) (Pt is progressing quickly, may progress to safely [facility] with HHPT. current recs still)     Assistance Recommended at Discharge Intermittent Supervision/Assistance  Patient can  return home with the following A little help with walking and/or transfers;A little help with bathing/dressing/bathroom;Assistance with cooking/housework;Help with stairs or ramp for entrance   Equipment Recommendations  None recommended by PT       Precautions / Restrictions Precautions Precautions: Fall;Posterior Hip Precaution Booklet Issued: Yes (comment) Restrictions Weight Bearing Restrictions: Yes LLE Weight Bearing: Weight bearing as tolerated     Mobility  Bed Mobility Overal bed mobility: Needs Assistance Bed Mobility: Supine to Sit, Sit to Supine  Supine to sit: Supervision   General bed mobility comments: Pt was able to exit L side of bed with min-mod assist + increased time. Vcs for technique with maintaining hip precautions however due to pain and weakness, did require physical assistance    Transfers Overall transfer level: Needs assistance Equipment used: Rolling walker (2 wheels) Transfers: Sit to/from Stand Sit to Stand: Min guard    General transfer comment: CGA for safety to stand from slightly elevated bed height. Vcs for handplacement, and avoiding to much fwd wt shift. Pt states and demonstrates understanding.    Ambulation/Gait Ambulation/Gait assistance: Min guard Gait Distance (Feet): 75 Feet Assistive device: Rolling walker (2 wheels) Gait Pattern/deviations: Step-through pattern, Antalgic Gait velocity: decreased     General Gait Details: Pt was able to ambulate ~75 ft with RW. Vcs for posture correction and improved heel strike. Pt does endorse pain increased however pain did not limit. Fatigue more limiting than pain.    Balance Overall balance assessment: Needs assistance Sitting-balance support: Feet unsupported  Sitting balance-Leahy Scale: Good     Standing balance support: During functional activity, Reliant on assistive device for balance, Bilateral upper extremity supported Standing balance-Leahy Scale: Fair Standing balance  comment: Pt is overall steady but slow moving. Reliant on RW for dynamic standing activity     Cognition Arousal/Alertness: Awake/alert Behavior During Therapy: WFL for tasks assessed/performed Overall Cognitive Status: Within Functional Limits for tasks assessed      General Comments: Pt is A and O x 4. ONly able to recall 1/3 hip precautions however with cueing and assist did remember other 2        Exercises Other Exercises Other Exercises: Posterior hip precaution education and review during functional tasks    General Comments General comments (skin integrity, edema, etc.): Recalled 1/3 precautions but with hints is able to get other 2      Pertinent Vitals/Pain Pain Assessment Pain Assessment: 0-10 Pain Score: 7  ((wt bearing)) Pain Location: L hip Pain Descriptors / Indicators: Aching, Sore Pain Intervention(s): Limited activity within patient's tolerance, Monitored during session, Premedicated before session, Repositioned     PT Goals (current goals can now be found in the care plan section) Acute Rehab PT Goals Patient Stated Goal: " Im feeling ok but hurt when I move." Progress towards PT goals: Progressing toward goals    Frequency    BID      PT Plan Current plan remains appropriate       AM-PAC PT "6 Clicks" Mobility   Outcome Measure  Help needed turning from your back to your side while in a flat bed without using bedrails?: A Little Help needed moving from lying on your back to sitting on the side of a flat bed without using bedrails?: A Lot Help needed moving to and from a bed to a chair (including a wheelchair)?: A Little Help needed standing up from a chair using your arms (e.g., wheelchair or bedside chair)?: A Little Help needed to walk in hospital room?: A Little Help needed climbing 3-5 steps with a railing? : A Little 6 Click Score: 17    End of Session Equipment Utilized During Treatment: Gait belt Activity Tolerance: Patient  tolerated treatment well Patient left: with call bell/phone within reach;with nursing/sitter in room;in chair;with chair alarm set Nurse Communication: Mobility status PT Visit Diagnosis: Muscle weakness (generalized) (M62.81);Other abnormalities of gait and mobility (R26.89);Pain Pain - Right/Left: Left Pain - part of body: Hip     Time: 0751-0823 PT Time Calculation (min) (ACUTE ONLY): 32 min  Charges:  $Gait Training: 8-22 mins $Therapeutic Activity: 8-22 mins                    Julaine Fusi PTA 11/27/21, 12:40 PM

## 2021-11-29 LAB — SURGICAL PATHOLOGY

## 2022-03-31 ENCOUNTER — Encounter: Payer: Self-pay | Admitting: Urology

## 2022-03-31 ENCOUNTER — Ambulatory Visit: Payer: No Typology Code available for payment source | Admitting: Urology

## 2022-03-31 VITALS — BP 98/65 | HR 80 | Ht 72.0 in | Wt 206.0 lb

## 2022-03-31 DIAGNOSIS — Z87448 Personal history of other diseases of urinary system: Secondary | ICD-10-CM | POA: Diagnosis not present

## 2022-03-31 DIAGNOSIS — R399 Unspecified symptoms and signs involving the genitourinary system: Secondary | ICD-10-CM

## 2022-03-31 LAB — BLADDER SCAN AMB NON-IMAGING: Scan Result: 0

## 2022-03-31 NOTE — Progress Notes (Signed)
03/31/2022 11:21 AM   Craig Ayala 1929/07/25 [id number]  Referring provider: Idelle Crouch, MD [facility] [place],  [place] [address]  Chief Complaint  Patient presents with   Follow-up    HPI: 89 y.o. male presents for annual follow-up.  Since last visit fell in October cystoscopy left femoral neck fracture and underwent left hip hemiarthroplasty No bothersome LUTS Denies dysuria, gross hematuria Denies flank, abdominal or pelvic pain   PMH: Past Medical History:  Diagnosis Date   Aneurysmal subarachnoid hemorrhage (HCC)    Cancer (HCC)    squamous cell   Gout    Kidney stone    Peptic ulcer    Pilonidal cyst    Spontaneous pneumothorax     Surgical History: Past Surgical History:  Procedure Laterality Date   CHOLECYSTECTOMY N/A 12/25/2016   Procedure: LAPAROSCOPIC CHOLECYSTECTOMY;  Surgeon: Jules Husbands, MD;  Location: [facility] ORS;  Service: General;  Laterality: N/A;   HIP ARTHROPLASTY Left 11/25/2021   Procedure: Posterior ARTHROPLASTY BIPOLAR HIP (HEMIARTHROPLASTY);  Surgeon: Steffanie Rainwater, MD;  Location: [facility] ORS;  Service: Orthopedics;  Laterality: Left;   UPPER GASTROINTESTINAL ENDOSCOPY      Home Medications:  Allergies as of 03/31/2022   No Known Allergies      Medication List        Accurate as of March 31, 2022 11:21 AM. If you have any questions, ask your nurse or doctor.          acetaminophen 500 MG tablet Commonly known as: TYLENOL Take 2 tablets (1,000 mg total) by mouth 3 (three) times daily.   aspirin EC 81 MG tablet Take 81 mg by mouth daily.   bisoprolol-hydrochlorothiazide 5-6.25 MG tablet Commonly known as: ZIAC Take 1 tablet by mouth daily.   D3 ADULT PO Take 1 tablet by mouth daily.   doxazosin 2 MG tablet Commonly known as: CARDURA Take 2 mg by mouth every evening.   Ferrous Sulfate 142 (45 Fe) MG Tbcr Take 45 mg by mouth daily.   Fish Oil 1200 MG Caps Take 1,200 mg by  mouth 2 (two) times daily.   latanoprost 0.005 % ophthalmic solution Commonly known as: XALATAN Place 1 drop into both eyes at bedtime.   omeprazole 20 MG capsule Commonly known as: PRILOSEC Take 20 mg by mouth daily.   tiZANidine 2 MG tablet Commonly known as: ZANAFLEX Take 2 mg by mouth at bedtime as needed for muscle spasms.        Allergies: No Known Allergies  Family History: History reviewed. No pertinent family history.  Social History:  reports that he has quit smoking. He has never used smokeless tobacco. He reports that he does not drink alcohol. No history on file for drug use.   Physical Exam: BP 98/65   Pulse 80   Ht 6' (1.829 m)   Wt 206 lb (93.4 kg)   BMI 27.94 kg/m   Constitutional:  Alert and oriented, No acute distress. HEENT: [place] AT Respiratory: Normal respiratory effort, no increased work of breathing. Psychiatric: Normal mood and affect.   Assessment & Plan:    1.  History bulbar urethral stricture Doing well without evidence of recurrent stricture Bladder scan PVR 0 mL Offered him prn versus annual visit and he would like to continue the latter.  Instructed to call earlier for any bothersome LUTS   Abbie Sons, MD  [facility] [address], [place], [place] [address] [phone]  [phone] 

## 2022-04-01 ENCOUNTER — Ambulatory Visit: Payer: No Typology Code available for payment source | Admitting: Urology

## 2022-09-01 DEATH — deceased

## 2023-02-16 ENCOUNTER — Other Ambulatory Visit: Payer: Self-pay | Admitting: Gastroenterology

## 2023-02-16 DIAGNOSIS — R1904 Left lower quadrant abdominal swelling, mass and lump: Secondary | ICD-10-CM

## 2023-02-20 ENCOUNTER — Ambulatory Visit
Admission: RE | Admit: 2023-02-20 | Discharge: 2023-02-20 | Disposition: A | Discharge status: Home / Self Care | Payer: Medicare Other | Source: Ambulatory Visit | Attending: Gastroenterology | Admitting: Gastroenterology

## 2023-02-20 DIAGNOSIS — R1904 Left lower quadrant abdominal swelling, mass and lump: Secondary | ICD-10-CM | POA: Diagnosis present

## 2023-02-20 MED ORDER — IOHEXOL 300 MG/ML  SOLN
100.0000 mL | Freq: Once | INTRAMUSCULAR | Status: AC | PRN
  Administered 2023-02-20: 100 mL via INTRAVENOUS

## 2023-03-31 ENCOUNTER — Encounter: Payer: Self-pay | Admitting: Urology

## 2023-03-31 ENCOUNTER — Ambulatory Visit: Payer: Medicare Other | Admitting: Urology

## 2023-03-31 VITALS — BP 173/84 | HR 86 | Ht 74.0 in | Wt 180.0 lb

## 2023-03-31 DIAGNOSIS — R399 Unspecified symptoms and signs involving the genitourinary system: Secondary | ICD-10-CM

## 2023-03-31 DIAGNOSIS — Z87448 Personal history of other diseases of urinary system: Secondary | ICD-10-CM

## 2023-03-31 LAB — BLADDER SCAN AMB NON-IMAGING: Scan Result: 28

## 2023-03-31 NOTE — Progress Notes (Signed)
 Craig Ayala,acting as a scribe for Riki Altes, MD.,have documented all relevant documentation on the behalf of Riki Altes, MD,as directed by  Riki Altes, MD while in the presence of Riki Altes, MD.  03/31/2023 11:19 AM   Craig Ayala 1929/02/16 [id number]  Referring provider: Marguarite Arbour, MD [address] [facility] [place],  [place] [address]  Chief Complaint  Patient presents with   Other   Urologic history 1. Urethral stricture Lower urinary tract symptoms secondary to a distal bulbar urethral stricture. Status post dilation, October 2022.  HPI: Craig Ayala is a 89 y.o. male presents for annual follow-up.  He has no bothersome lower urinary tract symptoms.  Denies dysuria, gross hematuria Denies flank, abdominal or pelvic pain     PMH: Past Medical History:  Diagnosis Date   Aneurysmal subarachnoid hemorrhage (HCC)    Cancer (HCC)    squamous cell   Gout    Kidney stone    Peptic ulcer    Pilonidal cyst    Spontaneous pneumothorax     Surgical History: Past Surgical History:  Procedure Laterality Date   CHOLECYSTECTOMY N/A 12/25/2016   Procedure: LAPAROSCOPIC CHOLECYSTECTOMY;  Surgeon: Leafy Ro, MD;  Location: [facility] ORS;  Service: General;  Laterality: N/A;   HIP ARTHROPLASTY Left 11/25/2021   Procedure: Posterior ARTHROPLASTY BIPOLAR HIP (HEMIARTHROPLASTY);  Surgeon: Reinaldo Berber, MD;  Location: [facility] ORS;  Service: Orthopedics;  Laterality: Left;   UPPER GASTROINTESTINAL ENDOSCOPY      Home Medications:  Allergies as of 03/31/2023   No Known Allergies      Medication List        Accurate as of March 31, 2023 11:19 AM. If you have any questions, ask your nurse or doctor.          STOP taking these medications    bisoprolol-hydrochlorothiazide 5-6.25 MG tablet Commonly known as: ZIAC Stopped by: Riki Altes       TAKE these medications    acetaminophen 500 MG  tablet Commonly known as: TYLENOL Take 2 tablets (1,000 mg total) by mouth 3 (three) times daily.   aspirin EC 81 MG tablet Take 81 mg by mouth daily.   carvedilol 6.25 MG tablet Commonly known as: COREG Take by mouth.   D3 ADULT PO Take 1 tablet by mouth daily.   doxazosin 2 MG tablet Commonly known as: CARDURA Take 2 mg by mouth every evening.   Eliquis 2.5 MG Tabs tablet Generic drug: apixaban Take 2.5 mg by mouth 2 (two) times daily.   Ferrous Sulfate 142 (45 Fe) MG Tbcr Take 45 mg by mouth daily.   Fish Oil 1200 MG Caps Take 1,200 mg by mouth 2 (two) times daily.   latanoprost 0.005 % ophthalmic solution Commonly known as: XALATAN Place 1 drop into both eyes at bedtime.   mirtazapine 15 MG tablet Commonly known as: REMERON Take by mouth.   omeprazole 20 MG capsule Commonly known as: PRILOSEC Take 20 mg by mouth daily.   tiZANidine 2 MG tablet Commonly known as: ZANAFLEX Take 2 mg by mouth at bedtime as needed for muscle spasms.   urea 40 % Crea Commonly known as: CARMOL Apply topically.        Allergies: No Known Allergies  Social History:  reports that he has quit smoking. He has never used smokeless tobacco. He reports that he does not drink alcohol. No history on file for drug use.  Physical Exam: BP (!) 173/84   Pulse 86   Ht 6\' 2"  (1.88 m)   Wt 180 lb (81.6 kg)   BMI 23.11 kg/m   Constitutional:  Alert and oriented, No acute distress. HEENT: [place] AT Respiratory: Normal respiratory effort, no increased work of breathing. Psychiatric: Normal mood and affect.   Assessment & Plan:    1.  History bulbar urethral stricture Doing well without evidence of recurrent stricture PVR 28 mL.  He would like to do PRN visits, and will call for any bothersome urinary symptoms.  I have reviewed the above documentation for accuracy and completeness, and I agree with the above.   Riki Altes, MD  [facility] [address], [address] [place], [place] [address] [phone]

## 2023-04-05 ENCOUNTER — Encounter: Payer: Self-pay | Admitting: Ophthalmology

## 2023-04-06 ENCOUNTER — Encounter: Payer: Self-pay | Admitting: Ophthalmology

## 2023-04-12 NOTE — Discharge Instructions (Signed)

## 2023-04-17 ENCOUNTER — Ambulatory Visit: Payer: Self-pay | Admitting: Anesthesiology

## 2023-04-17 ENCOUNTER — Other Ambulatory Visit: Payer: Self-pay

## 2023-04-17 ENCOUNTER — Encounter
Admission: RE | Disposition: A | Discharge status: Home / Self Care | Payer: Self-pay | Source: Home / Self Care | Attending: Ophthalmology

## 2023-04-17 ENCOUNTER — Encounter: Payer: Self-pay | Admitting: Ophthalmology

## 2023-04-17 ENCOUNTER — Ambulatory Visit
Admission: RE | Admit: 2023-04-17 | Discharge: 2023-04-17 | Disposition: A | Discharge status: Home / Self Care | Payer: Medicare Other | Attending: Ophthalmology | Admitting: Ophthalmology

## 2023-04-17 DIAGNOSIS — I129 Hypertensive chronic kidney disease with stage 1 through stage 4 chronic kidney disease, or unspecified chronic kidney disease: Secondary | ICD-10-CM | POA: Diagnosis not present

## 2023-04-17 DIAGNOSIS — Z87891 Personal history of nicotine dependence: Secondary | ICD-10-CM | POA: Diagnosis not present

## 2023-04-17 DIAGNOSIS — Z7901 Long term (current) use of anticoagulants: Secondary | ICD-10-CM | POA: Insufficient documentation

## 2023-04-17 DIAGNOSIS — D631 Anemia in chronic kidney disease: Secondary | ICD-10-CM | POA: Diagnosis not present

## 2023-04-17 DIAGNOSIS — I480 Paroxysmal atrial fibrillation: Secondary | ICD-10-CM | POA: Diagnosis not present

## 2023-04-17 DIAGNOSIS — H2512 Age-related nuclear cataract, left eye: Secondary | ICD-10-CM | POA: Insufficient documentation

## 2023-04-17 DIAGNOSIS — N183 Chronic kidney disease, stage 3 unspecified: Secondary | ICD-10-CM | POA: Insufficient documentation

## 2023-04-17 HISTORY — DX: Paroxysmal atrial fibrillation: I48.0

## 2023-04-17 HISTORY — DX: Anemia in chronic kidney disease: D63.1

## 2023-04-17 HISTORY — DX: Chronic kidney disease, stage 3 unspecified: N18.30

## 2023-04-17 HISTORY — DX: Nonrheumatic mitral (valve) insufficiency: I34.0

## 2023-04-17 HISTORY — DX: Essential (primary) hypertension: I10

## 2023-04-17 HISTORY — PX: CATARACT EXTRACTION W/PHACO: SHX586

## 2023-04-17 SURGERY — PHACOEMULSIFICATION, CATARACT, WITH IOL INSERTION
Anesthesia: Monitor Anesthesia Care | Site: Eye | Laterality: Left

## 2023-04-17 MED ORDER — FENTANYL CITRATE (PF) 100 MCG/2ML IJ SOLN
INTRAMUSCULAR | Status: AC
  Filled 2023-04-17: qty 2

## 2023-04-17 MED ORDER — SIGHTPATH DOSE#1 BSS IO SOLN
INTRAOCULAR | Status: DC | PRN
  Administered 2023-04-17: 90 mL via OPHTHALMIC

## 2023-04-17 MED ORDER — MIDAZOLAM HCL 2 MG/2ML IJ SOLN
INTRAMUSCULAR | Status: AC
  Filled 2023-04-17: qty 2

## 2023-04-17 MED ORDER — TETRACAINE HCL 0.5 % OP SOLN
OPHTHALMIC | Status: AC
  Filled 2023-04-17: qty 4

## 2023-04-17 MED ORDER — FENTANYL CITRATE (PF) 100 MCG/2ML IJ SOLN
INTRAMUSCULAR | Status: DC | PRN
Start: 2023-04-17 — End: 2023-04-17
  Administered 2023-04-17 (×2): 25 ug via INTRAVENOUS
  Administered 2023-04-17: 50 ug via INTRAVENOUS

## 2023-04-17 MED ORDER — LIDOCAINE HCL (PF) 2 % IJ SOLN
INTRAOCULAR | Status: DC | PRN
  Administered 2023-04-17: 4 mL via INTRAOCULAR

## 2023-04-17 MED ORDER — SIGHTPATH DOSE#1 NA HYALUR & NA CHOND-NA HYALUR IO KIT
PACK | INTRAOCULAR | Status: DC | PRN
  Administered 2023-04-17: 1 via OPHTHALMIC

## 2023-04-17 MED ORDER — TETRACAINE HCL 0.5 % OP SOLN
1.0000 [drp] | OPHTHALMIC | Status: DC | PRN
Start: 2023-04-17 — End: 2023-04-17
  Administered 2023-04-17 (×3): 1 [drp] via OPHTHALMIC

## 2023-04-17 MED ORDER — SIGHTPATH DOSE#1 BSS IO SOLN
INTRAOCULAR | Status: DC | PRN
  Administered 2023-04-17: 15 mL via INTRAOCULAR

## 2023-04-17 MED ORDER — DEXMEDETOMIDINE HCL IN NACL 80 MCG/20ML IV SOLN
INTRAVENOUS | Status: DC | PRN
  Administered 2023-04-17 (×2): 4 ug via INTRAVENOUS

## 2023-04-17 MED ORDER — ARMC OPHTHALMIC DILATING DROPS
1.0000 | OPHTHALMIC | Status: DC | PRN
  Administered 2023-04-17 (×3): 1 via OPHTHALMIC

## 2023-04-17 MED ORDER — ARMC OPHTHALMIC DILATING DROPS
OPHTHALMIC | Status: AC
  Filled 2023-04-17: qty 0.5

## 2023-04-17 MED ORDER — MOXIFLOXACIN HCL 0.5 % OP SOLN
OPHTHALMIC | Status: DC | PRN
  Administered 2023-04-17: .2 mL via OPHTHALMIC

## 2023-04-17 SURGICAL SUPPLY — 12 items
CATARACT SUITE SIGHTPATH (MISCELLANEOUS) ×1 IMPLANT
DISSECTOR HYDRO NUCLEUS 50X22 (MISCELLANEOUS) ×1 IMPLANT
FEE CATARACT SUITE SIGHTPATH (MISCELLANEOUS) ×1 IMPLANT
GLOVE PI ULTRA LF STRL 7.5 (GLOVE) ×1 IMPLANT
GLOVE SURG POLYISOPRENE 8.5 (GLOVE) ×1 IMPLANT
GLOVE SURG PROTEXIS BL SZ6.5 (GLOVE) ×1 IMPLANT
GLOVE SURG SYN 6.5 PF PI BL (GLOVE) ×1 IMPLANT
GLOVE SURG SYN 8.5 PF PI BL (GLOVE) ×1 IMPLANT
LENS IOL TECNIS EYHANCE 21.0 (Intraocular Lens) IMPLANT
NDL FILTER BLUNT 18X1 1/2 (NEEDLE) ×1 IMPLANT
NEEDLE FILTER BLUNT 18X1 1/2 (NEEDLE) ×1 IMPLANT
SYR 3ML LL SCALE MARK (SYRINGE) ×1 IMPLANT

## 2023-04-17 NOTE — Transfer of Care (Signed)
 Immediate Anesthesia Transfer of Care Note  Patient: Craig Ayala  Procedure(s) Performed: CATARACT EXTRACTION PHACO AND INTRAOCULAR LENS PLACEMENT (IOC) LEFT 7.66 00:42.7 (Left: Eye)  Patient Location: PACU  Anesthesia Type: MAC  Level of Consciousness: awake, alert  and patient cooperative  Airway and Oxygen Therapy: Patient Spontanous Breathing and Patient connected to supplemental oxygen  Post-op Assessment: Post-op Vital signs reviewed, Patient's Cardiovascular Status Stable, Respiratory Function Stable, Patent Airway and No signs of Nausea or vomiting  Post-op Vital Signs: Reviewed and stable  Complications: No notable events documented.

## 2023-04-17 NOTE — Op Note (Signed)
 OPERATIVE NOTE  Craig Ayala [id number] 04/17/2023   PREOPERATIVE DIAGNOSIS:  Nuclear sclerotic cataract left eye.  H25.12   POSTOPERATIVE DIAGNOSIS:    Nuclear sclerotic cataract left eye.     PROCEDURE:  Phacoemusification with posterior chamber intraocular lens placement of the left eye   LENS:   Implant Name Type Inv. Item Serial No. Manufacturer Lot No. LRB No. Used Action  LENS IOL TECNIS EYHANCE 21.0 - [id number] Intraocular Lens LENS IOL TECNIS EYHANCE 21.0 [id number] [employer]  Left 1 Implanted      Procedure(s): CATARACT EXTRACTION PHACO AND INTRAOCULAR LENS PLACEMENT (IOC) LEFT 7.66 00:42.7 (Left)  SURGEON:  Willey Blade, MD, MPH   ANESTHESIA:  Topical with tetracaine drops augmented with 1% preservative-free intracameral lidocaine.  ESTIMATED BLOOD LOSS: <1 mL   COMPLICATIONS:  None.   DESCRIPTION OF PROCEDURE:  The patient was identified in the holding room and transported to the operating room and placed in the supine position under the operating microscope.  The left eye was identified as the operative eye and it was prepped and draped in the usual sterile ophthalmic fashion.   A 1.0 millimeter clear-corneal paracentesis was made at the 5:00 position. 0.5 ml of preservative-free 1% lidocaine with epinephrine was injected into the anterior chamber.  The anterior chamber was filled with viscoelastic.  A 2.4 millimeter keratome was used to make a near-clear corneal incision at the 2:00 position.  A curvilinear capsulorrhexis was made with a cystotome and capsulorrhexis forceps.  Balanced salt solution was used to hydrodissect and hydrodelineate the nucleus.   Phacoemulsification was then used in stop and chop fashion to remove the lens nucleus and epinucleus.  The remaining cortex was then removed using the irrigation and aspiration handpiece. Viscoelastic was then placed into the capsular bag to distend it for lens placement.  A lens was then injected into the capsular  bag.  The remaining viscoelastic was aspirated.   Wounds were hydrated with balanced salt solution.  The anterior chamber was inflated to a physiologic pressure with balanced salt solution.  Intracameral vigamox 0.1 mL undiltued was injected into the eye and a drop placed onto the ocular surface.  No wound leaks were noted.  The patient was taken to the recovery room in stable condition without complications of anesthesia or surgery  Willey Blade 04/17/2023, 2:02 PM

## 2023-04-17 NOTE — H&P (Signed)
 [facility]   Primary Care Physician:  Marguarite Arbour, MD Ophthalmologist: Dr. Willey Blade  Pre-Procedure History & Physical: HPI:  Craig Ayala is a 89 y.o. male here for cataract surgery.   Past Medical History:  Diagnosis Date   Anemia in stage 3 chronic kidney disease (HCC)    Aneurysmal subarachnoid hemorrhage (HCC)    approx 1969 or 1970   Cancer (HCC)    squamous cell   Gout    Hypertension    Kidney stone    Mild mitral regurgitation by prior echocardiogram    Paroxysmal atrial fibrillation (HCC)    Peptic ulcer    Pilonidal cyst    Spontaneous pneumothorax     Past Surgical History:  Procedure Laterality Date   CAROTID STENT Right 1969   "clamp" placed (per pt for Carotid aneurysm repair   CHOLECYSTECTOMY N/A 12/25/2016   Procedure: LAPAROSCOPIC CHOLECYSTECTOMY;  Surgeon: Leafy Ro, MD;  Location: [facility] ORS;  Service: General;  Laterality: N/A;   HERNIA REPAIR     HIP ARTHROPLASTY Left 11/25/2021   Procedure: Posterior ARTHROPLASTY BIPOLAR HIP (HEMIARTHROPLASTY);  Surgeon: Reinaldo Berber, MD;  Location: [facility] ORS;  Service: Orthopedics;  Laterality: Left;   TONSILLECTOMY     UPPER GASTROINTESTINAL ENDOSCOPY      Prior to Admission medications   Medication Sig Start Date End Date Taking? Authorizing Provider  acetaminophen (TYLENOL) 500 MG tablet Take 2 tablets (1,000 mg total) by mouth 3 (three) times daily. 11/27/21  Yes Leeroy Bock, MD  carvedilol (COREG) 6.25 MG tablet Take by mouth. 08/09/22 08/09/23 Yes [provider]  Cholecalciferol (D3 ADULT PO) Take 1 tablet by mouth daily.   Yes [provider]  doxazosin (CARDURA) 2 MG tablet Take 2 mg by mouth every evening. 10/29/21  Yes [provider]  ELIQUIS 2.5 MG TABS tablet Take 2.5 mg by mouth 2 (two) times daily.   Yes [provider]  Ferrous Sulfate 142 (45 Fe) MG TBCR Take 45 mg by mouth daily.   Yes [provider]  latanoprost (XALATAN)  0.005 % ophthalmic solution Place 1 drop into both eyes at bedtime.   Yes [provider]  mirtazapine (REMERON) 15 MG tablet Take by mouth. 03/03/23  Yes [provider]  Multiple Vitamin (MULTIVITAMIN) tablet Take 1 tablet by mouth daily.   Yes [provider]  Omega-3 Fatty Acids (FISH OIL) 1200 MG CAPS Take 1,200 mg by mouth 2 (two) times daily.   Yes [provider]  omeprazole (PRILOSEC) 20 MG capsule Take 20 mg by mouth daily. 10/25/16  Yes [provider]  urea (CARMOL) 40 % CREA Apply topically. 03/10/23  Yes [provider]    Allergies as of 03/16/2023   (No Known Allergies)    History reviewed. No pertinent family history.  Social History   Socioeconomic History   Marital status: Married    Spouse name: Not on file   Number of children: Not on file   Years of education: Not on file   Highest education level: Not on file  Occupational History   Not on file  Tobacco Use   Smoking status: Former    Current packs/day: 0.00    Average packs/day: 1 pack/day for 20.0 years (20.0 ttl pk-yrs)    Types: Cigarettes    Start date: 14    Quit date: 55    Years since quitting: 55.2   Smokeless tobacco: Never  Vaping Use   Vaping  status: Never Used  Substance and Sexual Activity   Alcohol use: No   Drug use: Not on file   Sexual activity: Not on file  Other Topics Concern   Not on file  Social History Narrative   Not on file   Social Drivers of Health   Financial Resource Strain: Low Risk  (01/17/2023)   Received from [facility]   Overall Financial Resource Strain (CARDIA)    Difficulty of Paying Living Expenses: Not hard at all  Food Insecurity: No Food Insecurity (01/17/2023)   Received from [facility]   Hunger Vital Sign    Worried About Running Out of Food in the Last Year: Never true    Ran Out of Food in the Last Year: Never true  Transportation Needs: No  Transportation Needs (01/17/2023)   Received from [facility] - Transportation    In the past 12 months, has lack of transportation kept you from medical appointments or from getting medications?: No    Lack of Transportation (Non-Medical): No  Physical Activity: Not on file  Stress: Not on file  Social Connections: Not on file  Intimate Partner Violence: Not At Risk (11/25/2021)   Humiliation, Afraid, Rape, and Kick questionnaire    Fear of Current or Ex-Partner: No    Emotionally Abused: No    Physically Abused: No    Sexually Abused: No    Review of Systems: See HPI, otherwise negative ROS  Physical Exam: BP (!) 156/89   Pulse 78   Temp (!) 97.5 F (36.4 C) (Temporal)   Resp 16   Ht 6\' 1"  (1.854 m)   Wt 84.4 kg   SpO2 97%   BMI 24.54 kg/m  General:   Alert, cooperative in NAD Head:  Normocephalic and atraumatic. Respiratory:  Normal work of breathing. Cardiovascular:  RRR  Impression/Plan: Craig Ayala is here for cataract surgery.  Risks, benefits, limitations, and alternatives regarding cataract surgery have been reviewed with the patient.  Questions have been answered.  All parties agreeable.   Willey Blade, MD  04/17/2023, 1:37 PM

## 2023-04-17 NOTE — Anesthesia Postprocedure Evaluation (Signed)
 Anesthesia Post Note  Patient: Craig Ayala  Procedure(s) Performed: CATARACT EXTRACTION PHACO AND INTRAOCULAR LENS PLACEMENT (IOC) LEFT 7.66 00:42.7 (Left: Eye)  Patient location during evaluation: PACU Anesthesia Type: MAC Level of consciousness: awake and alert Pain management: pain level controlled Vital Signs Assessment: post-procedure vital signs reviewed and stable Respiratory status: spontaneous breathing, nonlabored ventilation, respiratory function stable and patient connected to nasal cannula oxygen Cardiovascular status: stable and blood pressure returned to baseline Postop Assessment: no apparent nausea or vomiting Anesthetic complications: no   No notable events documented.   Last Vitals:  Vitals:   04/17/23 1403 04/17/23 1412  BP: (!) 183/106 (!) 162/95  Pulse: 82   Resp: 18   Temp: 36.6 C 36.6 C  SpO2: 98%     Last Pain:  Vitals:   04/17/23 1412  TempSrc:   PainSc: 0-No pain                 Khalfani Weideman C Randa Riss

## 2023-04-17 NOTE — Anesthesia Preprocedure Evaluation (Addendum)
 Anesthesia Evaluation  Patient identified by MRN, date of birth, ID band Patient awake    Reviewed: Allergy & Precautions, H&P , NPO status , Patient's Chart, lab work & pertinent test results  Airway Mallampati: III       Dental  (+) Chipped, Poor Dentition   Pulmonary former smoker          Cardiovascular hypertension,  Rhythm:Irregular  10-12-22  DOPPLER ECHO and OTHER SPECIAL PROCEDURES                 Aortic: TRIVIAL AR                 No AS                         130.0 cm/sec peak vel      6.8 mmHg peak grad                         4.0 mmHg mean grad         2.1 cm^2 by DOPPLER                 Mitral: MILD MR                    No MS                         MV Inflow E Vel = 92.8 cm/sec       MV Annulus E'Vel = 10.0 cm/sec                         E/E'Ratio = 9.3              Tricuspid: TRIVIAL TR                 No TS                         231.0 cm/sec peak TR vel   24.3 mmHg peak RV pressure              Pulmonary: TRIVIAL PR                 No PS                         91.7 cm/sec peak vel       3.4 mmHg peak grad  _________________________________________________________________________________________  INTERPRETATION  NORMAL LEFT VENTRICULAR SYSTOLIC FUNCTION  NORMAL RIGHT VENTRICULAR SYSTOLIC FUNCTION  MILD VALVULAR REGURGITATION (See above)  NO VALVULAR STENOSIS  ESTIMATED LVEF: >55%  MODERATE LAE  MODERATELY DILATED ASCENDING AORTA MEASURING UP TO 4.2cm     Neuro/Psych negative neurological ROS  negative psych ROS   GI/Hepatic Neg liver ROS, PUD,,,  Endo/Other  negative endocrine ROS    Renal/GU Renal disease  negative genitourinary   Musculoskeletal negative musculoskeletal ROS (+)    Abdominal   Peds negative pediatric ROS (+)  Hematology  (+) Blood dyscrasia, anemia   Anesthesia Other Findings Peptic ulcer  Spontaneous pneumothorax Pilonidal cyst  Aneurysmal subarachnoid hemorrhage  (HCC) Gout Kidney stone Cancer (HCC)  Paroxysmal atrial fibrillation (HCC) Hypertension  Anemia in stage 3 chronic kidney disease  Mild mitral regurgitation by prior echocardiogram   BP elevated on arrival , but settled down  Reproductive/Obstetrics negative OB ROS                             Anesthesia Physical Anesthesia Plan  ASA: 3  Anesthesia Plan: MAC   Post-op Pain Management:    Induction: Intravenous  PONV Risk Score and Plan:   Airway Management Planned: Natural Airway and Nasal Cannula  Additional Equipment:   Intra-op Plan:   Post-operative Plan:   Informed Consent: I have reviewed the patients History and Physical, chart, labs and discussed the procedure including the risks, benefits and alternatives for the proposed anesthesia with the patient or authorized representative who has indicated his/her understanding and acceptance.     Dental Advisory Given  Plan Discussed with: Anesthesiologist, CRNA and Surgeon  Anesthesia Plan Comments: (Patient consented for risks of anesthesia including but not limited to:  - adverse reactions to medications - damage to eyes, teeth, lips or other oral mucosa - nerve damage due to positioning  - sore throat or hoarseness - Damage to heart, brain, nerves, lungs, other parts of body or loss of life  Patient voiced understanding and assent.)       Anesthesia Quick Evaluation

## 2023-04-18 ENCOUNTER — Encounter: Payer: Self-pay | Admitting: Ophthalmology

## 2023-04-18 NOTE — Anesthesia Preprocedure Evaluation (Addendum)
 Anesthesia Evaluation  Patient identified by MRN, date of birth, ID band Patient awake    Reviewed: Allergy & Precautions, H&P , NPO status , Patient's Chart, lab work & pertinent test results  Airway Mallampati: III  TM Distance: >3 FB Neck ROM: Full    Dental  (+) Poor Dentition, Chipped   Pulmonary neg pulmonary ROS, former smoker   Pulmonary exam normal breath sounds clear to auscultation       Cardiovascular hypertension, negative cardio ROS Normal cardiovascular exam Rhythm:Regular Rate:Normal  10-12-22   DOPPLER ECHO and OTHER SPECIAL PROCEDURES                 Aortic: TRIVIAL AR                 No AS                         130.0 cm/sec peak vel      6.8 mmHg peak grad                         4.0 mmHg mean grad         2.1 cm^2 by DOPPLER                 Mitral: MILD MR                    No MS                         MV Inflow E Vel = 92.8 cm/sec       MV Annulus E'Vel = 10.0 cm/sec                         E/E'Ratio = 9.3              Tricuspid: TRIVIAL TR                 No TS                         231.0 cm/sec peak TR vel   24.3 mmHg peak RV pressure              Pulmonary: TRIVIAL PR                 No PS                         91.7 cm/sec peak vel       3.4 mmHg peak grad  _________________________________________________________________________________________  INTERPRETATION  NORMAL LEFT VENTRICULAR SYSTOLIC FUNCTION  NORMAL RIGHT VENTRICULAR SYSTOLIC FUNCTION  MILD VALVULAR REGURGITATION (See above)  NO VALVULAR STENOSIS  ESTIMATED LVEF: >55%  MODERATE LAE  MODERATELY DILATED ASCENDING AORTA MEASURING UP TO 4.2cm         Neuro/Psych negative neurological ROS  negative psych ROS   GI/Hepatic negative GI ROS, Neg liver ROS,,,  Endo/Other  negative endocrine ROS    Renal/GU negative Renal ROS  negative genitourinary   Musculoskeletal negative musculoskeletal ROS (+)    Abdominal    Peds negative pediatric ROS (+)  Hematology negative hematology ROS (+)   Anesthesia Other Findings Previous cataract surgery 04-18-23  Peptic ulcer  Spontaneous pneumothorax Pilonidal cyst  Aneurysmal subarachnoid hemorrhage (HCC) Gout  Kidney stone Cancer (  HCC)  Paroxysmal atrial fibrillation (HCC) Hypertension  Anemia in stage 3 chronic kidney disease  Mild mitral regurgitation by prior echocardiogram     Reproductive/Obstetrics negative OB ROS                              Anesthesia Physical Anesthesia Plan  ASA: 3  Anesthesia Plan: MAC   Post-op Pain Management:    Induction: Intravenous  PONV Risk Score and Plan:   Airway Management Planned: Natural Airway and Nasal Cannula  Additional Equipment:   Intra-op Plan:   Post-operative Plan:   Informed Consent: I have reviewed the patients History and Physical, chart, labs and discussed the procedure including the risks, benefits and alternatives for the proposed anesthesia with the patient or authorized representative who has indicated his/her understanding and acceptance.     Dental Advisory Given  Plan Discussed with: Anesthesiologist, CRNA and Surgeon  Anesthesia Plan Comments: (Patient consented for risks of anesthesia including but not limited to:  - adverse reactions to medications - damage to eyes, teeth, lips or other oral mucosa - nerve damage due to positioning  - sore throat or hoarseness - Damage to heart, brain, nerves, lungs, other parts of body or loss of life  Patient voiced understanding and assent.)         Anesthesia Quick Evaluation

## 2023-05-01 ENCOUNTER — Encounter: Payer: Self-pay | Admitting: Ophthalmology

## 2023-05-01 ENCOUNTER — Ambulatory Visit: Payer: Self-pay | Admitting: Anesthesiology

## 2023-05-01 ENCOUNTER — Encounter
Admission: RE | Disposition: A | Discharge status: Home / Self Care | Payer: Self-pay | Source: Home / Self Care | Attending: Ophthalmology

## 2023-05-01 ENCOUNTER — Other Ambulatory Visit: Payer: Self-pay

## 2023-05-01 ENCOUNTER — Ambulatory Visit
Admission: RE | Admit: 2023-05-01 | Discharge: 2023-05-01 | Disposition: A | Discharge status: Home / Self Care | Payer: Medicare Other | Attending: Ophthalmology | Admitting: Ophthalmology

## 2023-05-01 DIAGNOSIS — N183 Chronic kidney disease, stage 3 unspecified: Secondary | ICD-10-CM | POA: Insufficient documentation

## 2023-05-01 DIAGNOSIS — Z87891 Personal history of nicotine dependence: Secondary | ICD-10-CM | POA: Diagnosis not present

## 2023-05-01 DIAGNOSIS — H2511 Age-related nuclear cataract, right eye: Secondary | ICD-10-CM | POA: Diagnosis present

## 2023-05-01 DIAGNOSIS — I129 Hypertensive chronic kidney disease with stage 1 through stage 4 chronic kidney disease, or unspecified chronic kidney disease: Secondary | ICD-10-CM | POA: Insufficient documentation

## 2023-05-01 HISTORY — PX: CATARACT EXTRACTION W/PHACO: SHX586

## 2023-05-01 SURGERY — PHACOEMULSIFICATION, CATARACT, WITH IOL INSERTION
Anesthesia: Monitor Anesthesia Care | Site: Eye | Laterality: Right

## 2023-05-01 MED ORDER — SIGHTPATH DOSE#1 NA HYALUR & NA CHOND-NA HYALUR IO KIT
PACK | INTRAOCULAR | Status: DC | PRN
  Administered 2023-05-01: 1 via OPHTHALMIC

## 2023-05-01 MED ORDER — MIDAZOLAM HCL 2 MG/2ML IJ SOLN
INTRAMUSCULAR | Status: AC
  Filled 2023-05-01: qty 2

## 2023-05-01 MED ORDER — MOXIFLOXACIN HCL 0.5 % OP SOLN
OPHTHALMIC | Status: DC | PRN
  Administered 2023-05-01: .2 mL via OPHTHALMIC

## 2023-05-01 MED ORDER — TETRACAINE HCL 0.5 % OP SOLN
1.0000 [drp] | OPHTHALMIC | Status: DC | PRN
Start: 2023-05-01 — End: 2023-05-01
  Administered 2023-05-01 (×3): 1 [drp] via OPHTHALMIC

## 2023-05-01 MED ORDER — ARMC OPHTHALMIC DILATING DROPS
OPHTHALMIC | Status: AC
  Filled 2023-05-01: qty 0.5

## 2023-05-01 MED ORDER — GLYCOPYRROLATE 0.2 MG/ML IJ SOLN
INTRAMUSCULAR | Status: AC
  Filled 2023-05-01: qty 1

## 2023-05-01 MED ORDER — LIDOCAINE HCL (PF) 2 % IJ SOLN
INTRAOCULAR | Status: DC | PRN
  Administered 2023-05-01: 4 mL via INTRAOCULAR

## 2023-05-01 MED ORDER — FENTANYL CITRATE (PF) 100 MCG/2ML IJ SOLN
INTRAMUSCULAR | Status: DC | PRN
  Administered 2023-05-01: 12.5 ug via INTRAVENOUS

## 2023-05-01 MED ORDER — FENTANYL CITRATE (PF) 100 MCG/2ML IJ SOLN
INTRAMUSCULAR | Status: AC
Start: 2023-05-01 — End: ?
  Filled 2023-05-01: qty 2

## 2023-05-01 MED ORDER — SIGHTPATH DOSE#1 BSS IO SOLN
INTRAOCULAR | Status: DC | PRN
  Administered 2023-05-01: 114 mL via OPHTHALMIC

## 2023-05-01 MED ORDER — MIDAZOLAM HCL 2 MG/2ML IJ SOLN
INTRAMUSCULAR | Status: DC | PRN
Start: 2023-05-01 — End: 2023-05-01
  Administered 2023-05-01: .5 mg via INTRAVENOUS

## 2023-05-01 MED ORDER — GLYCOPYRROLATE 0.2 MG/ML IJ SOLN
INTRAMUSCULAR | Status: DC | PRN
  Administered 2023-05-01: .2 mg via INTRAVENOUS

## 2023-05-01 MED ORDER — SODIUM CHLORIDE 0.9% FLUSH
INTRAVENOUS | Status: DC | PRN
  Administered 2023-05-01 (×2): 10 mL via INTRAVENOUS

## 2023-05-01 MED ORDER — TETRACAINE HCL 0.5 % OP SOLN
OPHTHALMIC | Status: AC
  Filled 2023-05-01: qty 4

## 2023-05-01 MED ORDER — ARMC OPHTHALMIC DILATING DROPS
1.0000 | OPHTHALMIC | Status: DC | PRN
  Administered 2023-05-01 (×3): 1 via OPHTHALMIC

## 2023-05-01 MED ORDER — SIGHTPATH DOSE#1 BSS IO SOLN
INTRAOCULAR | Status: DC | PRN
  Administered 2023-05-01: 15 mL via INTRAOCULAR

## 2023-05-01 SURGICAL SUPPLY — 12 items
CATARACT SUITE SIGHTPATH (MISCELLANEOUS) ×1 IMPLANT
DISSECTOR HYDRO NUCLEUS 50X22 (MISCELLANEOUS) ×1 IMPLANT
FEE CATARACT SUITE SIGHTPATH (MISCELLANEOUS) ×1 IMPLANT
GLOVE PI ULTRA LF STRL 7.5 (GLOVE) ×1 IMPLANT
GLOVE SURG POLYISOPRENE 8.5 (GLOVE) ×1 IMPLANT
GLOVE SURG PROTEXIS BL SZ6.5 (GLOVE) ×1 IMPLANT
GLOVE SURG SYN 6.5 PF PI BL (GLOVE) ×1 IMPLANT
GLOVE SURG SYN 8.5 PF PI BL (GLOVE) ×1 IMPLANT
LENS IOL TECNIS EYHANCE 20.5 (Intraocular Lens) IMPLANT
NDL FILTER BLUNT 18X1 1/2 (NEEDLE) ×1 IMPLANT
NEEDLE FILTER BLUNT 18X1 1/2 (NEEDLE) ×1 IMPLANT
SYR 3ML LL SCALE MARK (SYRINGE) ×1 IMPLANT

## 2023-05-01 NOTE — Transfer of Care (Signed)
 Immediate Anesthesia Transfer of Care Note  Patient: Craig Ayala  Procedure(s) Performed: CATARACT EXTRACTION PHACO AND INTRAOCULAR LENS PLACEMENT (IOC) RIGHT 8.07 00:55.5 (Right: Eye)  Patient Location: PACU  Anesthesia Type:MAC  Level of Consciousness: awake and alert   Airway & Oxygen Therapy: Patient Spontanous Breathing  Post-op Assessment: Report given to RN and Post -op Vital signs reviewed and stable  Post vital signs: Reviewed and stable  Last Vitals:  Vitals Value Taken Time  BP 157/76 05/01/23 0849  Temp 36.6 C 05/01/23 0849  Pulse 76 05/01/23 0853  Resp 18 05/01/23 0853  SpO2 98 % 05/01/23 0853  Vitals shown include unfiled device data.  Last Pain:  Vitals:   05/01/23 0849  TempSrc:   PainSc: 0-No pain         Complications: No notable events documented.

## 2023-05-01 NOTE — Op Note (Signed)
 OPERATIVE NOTE  MATTIAS WALMSLEY [id number] 05/01/2023   PREOPERATIVE DIAGNOSIS:  Nuclear sclerotic cataract right eye.  H25.11   POSTOPERATIVE DIAGNOSIS:    Nuclear sclerotic cataract right eye.     PROCEDURE:  Phacoemusification with posterior chamber intraocular lens placement of the right eye   LENS:   Implant Name Type Inv. Item Serial No. Manufacturer Lot No. LRB No. Used Action  LENS IOL TECNIS EYHANCE 20.5 - [id number] Intraocular Lens LENS IOL TECNIS EYHANCE 20.5 [id number] [employer]  Right 1 Implanted       Procedure(s): CATARACT EXTRACTION PHACO AND INTRAOCULAR LENS PLACEMENT (IOC) RIGHT 8.07 00:55.5 (Right)   SURGEON:  Willey Blade, MD, MPH  ANESTHESIOLOGIST: Anesthesiologist: Marisue Humble, MD CRNA: Genia Del, CRNA   ANESTHESIA:  Topical with tetracaine drops augmented with 1% preservative-free intracameral lidocaine.  ESTIMATED BLOOD LOSS: less than 1 mL.   COMPLICATIONS:  None.   DESCRIPTION OF PROCEDURE:  The patient was identified in the holding room and transported to the operating room and placed in the supine position under the operating microscope.  The right eye was identified as the operative eye and it was prepped and draped in the usual sterile ophthalmic fashion.   A 1.0 millimeter clear-corneal paracentesis was made at the 10:30 position. 0.5 ml of preservative-free 1% lidocaine with epinephrine was injected into the anterior chamber.  The anterior chamber was filled with viscoelastic.  A 2.4 millimeter keratome was used to make a near-clear corneal incision at the 8:00 position.  A curvilinear capsulorrhexis was made with a cystotome and capsulorrhexis forceps.  Balanced salt solution was used to hydrodissect and hydrodelineate the nucleus.   Phacoemulsification was then used in stop and chop fashion to remove the lens nucleus and epinucleus.  The remaining cortex was then removed using the irrigation and aspiration handpiece. Viscoelastic was then  placed into the capsular bag to distend it for lens placement.  A lens was then injected into the capsular bag.  The remaining viscoelastic was aspirated.   Wounds were hydrated with balanced salt solution.  The anterior chamber was inflated to a physiologic pressure with balanced salt solution.   Intracameral vigamox 0.1 mL undiluted was injected into the eye and a drop placed onto the ocular surface.  No wound leaks were noted.  The patient was taken to the recovery room in stable condition without complications of anesthesia or surgery  Willey Blade 05/01/2023, 8:48 AM

## 2023-05-01 NOTE — H&P (Signed)
 [facility]   Primary Care Physician:  Marguarite Arbour, MD Ophthalmologist: Dr. Willey Blade  Pre-Procedure History & Physical: HPI:  Craig Ayala is a 89 y.o. male here for cataract surgery.   Past Medical History:  Diagnosis Date   Anemia in stage 3 chronic kidney disease (HCC)    Aneurysmal subarachnoid hemorrhage (HCC)    approx 1969 or 1970   Cancer (HCC)    squamous cell   Gout    Hypertension    Kidney stone    Mild mitral regurgitation by prior echocardiogram    Paroxysmal atrial fibrillation (HCC)    Peptic ulcer    Pilonidal cyst    Spontaneous pneumothorax     Past Surgical History:  Procedure Laterality Date   CAROTID STENT Right 1969   "clamp" placed (per pt for Carotid aneurysm repair   CATARACT EXTRACTION W/PHACO Left 04/17/2023   Procedure: CATARACT EXTRACTION PHACO AND INTRAOCULAR LENS PLACEMENT (IOC) LEFT 7.66 00:42.7;  Surgeon: Nevada Crane, MD;  Location: [facility];  Service: Ophthalmology;  Laterality: Left;   CHOLECYSTECTOMY N/A 12/25/2016   Procedure: LAPAROSCOPIC CHOLECYSTECTOMY;  Surgeon: Leafy Ro, MD;  Location: [facility];  Service: General;  Laterality: N/A;   HERNIA REPAIR     HIP ARTHROPLASTY Left 11/25/2021   Procedure: Posterior ARTHROPLASTY BIPOLAR HIP (HEMIARTHROPLASTY);  Surgeon: Reinaldo Berber, MD;  Location: [facility];  Service: Orthopedics;  Laterality: Left;   TONSILLECTOMY     UPPER GASTROINTESTINAL ENDOSCOPY      Prior to Admission medications   Medication Sig Start Date End Date Taking? Authorizing Provider  acetaminophen (TYLENOL) 500 MG tablet Take 2 tablets (1,000 mg total) by mouth 3 (three) times daily. 11/27/21  Yes Leeroy Bock, MD  carvedilol (COREG) 6.25 MG tablet Take by mouth. 08/09/22 08/09/23 Yes [provider]  Cholecalciferol (D3 ADULT PO) Take 1 tablet by mouth daily.   Yes [provider]  doxazosin (CARDURA) 2 MG tablet Take 2 mg by mouth every evening. 10/29/21   Yes [provider]  ELIQUIS 2.5 MG TABS tablet Take 2.5 mg by mouth 2 (two) times daily.   Yes [provider]  Ferrous Sulfate 142 (45 Fe) MG TBCR Take 45 mg by mouth daily.   Yes [provider]  latanoprost (XALATAN) 0.005 % ophthalmic solution Place 1 drop into both eyes at bedtime.   Yes [provider]  mirtazapine (REMERON) 15 MG tablet Take by mouth. 03/03/23  Yes [provider]  Multiple Vitamin (MULTIVITAMIN) tablet Take 1 tablet by mouth daily.   Yes [provider]  Omega-3 Fatty Acids (FISH OIL) 1200 MG CAPS Take 1,200 mg by mouth 2 (two) times daily.   Yes [provider]  omeprazole (PRILOSEC) 20 MG capsule Take 20 mg by mouth daily. 10/25/16  Yes [provider]  urea (CARMOL) 40 % CREA Apply topically. 03/10/23   [provider]    Allergies as of 03/16/2023   (No Known Allergies)    History reviewed. No pertinent family history.  Social History   Socioeconomic History   Marital status: Married    Spouse name: Not on file   Number of children: Not on file   Years of education: Not on file   Highest education level: Not on file  Occupational History   Not on file  Tobacco Use   Smoking status: Former    Current packs/day: 0.00    Average packs/day: 1 pack/day for 20.0 years (  20.0 ttl pk-yrs)    Types: Cigarettes    Start date: 25    Quit date: 50    Years since quitting: 55.2   Smokeless tobacco: Never  Vaping Use   Vaping status: Never Used  Substance and Sexual Activity   Alcohol use: No   Drug use: Not on file   Sexual activity: Not on file  Other Topics Concern   Not on file  Social History Narrative   Not on file   Social Drivers of Health   Financial Resource Strain: Low Risk  (01/17/2023)   Received from [facility]   Overall Financial Resource Strain (CARDIA)    Difficulty of Paying Living Expenses: Not hard at all  Food Insecurity: No Food  Insecurity (01/17/2023)   Received from [facility]   Hunger Vital Sign    Worried About Running Out of Food in the Last Year: Never true    Ran Out of Food in the Last Year: Never true  Transportation Needs: No Transportation Needs (01/17/2023)   [facility] from [facility] - Transportation    In the past 12 months, has lack of transportation kept you from medical appointments or from getting medications?: No    Lack of Transportation (Non-Medical): No  Physical Activity: Not on file  Stress: Not on file  Social Connections: Not on file  Intimate Partner Violence: Not At Risk (11/25/2021)   Humiliation, Afraid, Rape, and Kick questionnaire    Fear of Current or Ex-Partner: No    Emotionally Abused: No    Physically Abused: No    Sexually Abused: No    Review of Systems: See HPI, otherwise negative ROS  Physical Exam: BP (!) 169/92   Temp 97.8 F (36.6 C) (Temporal)   Ht 6' 0.99" (1.854 m)   Wt 85.5 kg   SpO2 96%   BMI 24.88 kg/m  General:   Alert, cooperative in NAD Head:  Normocephalic and atraumatic. Respiratory:  Normal work of breathing. Cardiovascular:  RRR  Impression/Plan: Craig Ayala is here for cataract surgery.  Risks, benefits, limitations, and alternatives regarding cataract surgery have been reviewed with the patient.  Questions have been answered.  All parties agreeable.   Willey Blade, MD  05/01/2023, 8:20 AM

## 2023-05-01 NOTE — Anesthesia Postprocedure Evaluation (Signed)
 Anesthesia Post Note  Patient: ISRAEL WERTS  Procedure(s) Performed: CATARACT EXTRACTION PHACO AND INTRAOCULAR LENS PLACEMENT (IOC) RIGHT 8.07 00:55.5 (Right: Eye)  Patient location during evaluation: PACU Anesthesia Type: MAC Level of consciousness: awake and alert Pain management: pain level controlled Vital Signs Assessment: post-procedure vital signs reviewed and stable Respiratory status: spontaneous breathing, nonlabored ventilation, respiratory function stable and patient connected to nasal cannula oxygen Cardiovascular status: stable and blood pressure returned to baseline Postop Assessment: no apparent nausea or vomiting Anesthetic complications: no   No notable events documented.   Last Vitals:  Vitals:   05/01/23 0850 05/01/23 0855  BP:  115/71  Pulse: 70 79  Resp: 18 12  Temp:  36.6 C  SpO2: 99% 98%    Last Pain:  Vitals:   05/01/23 0855  TempSrc:   PainSc: 0-No pain                 Kamari Bilek C Myron Stankovich

## 2023-05-01 NOTE — Discharge Instructions (Signed)
